# Patient Record
Sex: Female | Born: 1960
Health system: Southern US, Community
[De-identification: ages and names within clinical notes are randomized; demographics above are authoritative.]

## PROBLEM LIST (undated history)

## (undated) DIAGNOSIS — I10 Essential (primary) hypertension: Secondary | ICD-10-CM

## (undated) HISTORY — DX: Essential (primary) hypertension: I10

## (undated) HISTORY — PX: TOTAL ABDOMINAL HYSTERECTOMY: SHX209

## (undated) HISTORY — PX: ABDOMINAL HYSTERECTOMY: SHX81

---

## 2004-04-10 ENCOUNTER — Other Ambulatory Visit: Payer: Self-pay

## 2004-04-10 ENCOUNTER — Emergency Department: Payer: Self-pay | Admitting: Emergency Medicine

## 2004-10-08 ENCOUNTER — Emergency Department: Payer: Self-pay | Admitting: Emergency Medicine

## 2005-09-08 ENCOUNTER — Emergency Department: Payer: Self-pay | Admitting: Emergency Medicine

## 2005-09-08 ENCOUNTER — Other Ambulatory Visit: Payer: Self-pay

## 2007-04-27 ENCOUNTER — Emergency Department: Payer: Self-pay | Admitting: Internal Medicine

## 2009-01-17 ENCOUNTER — Ambulatory Visit: Payer: Self-pay

## 2009-01-17 ENCOUNTER — Ambulatory Visit: Payer: Self-pay | Admitting: Cardiology

## 2009-03-01 ENCOUNTER — Ambulatory Visit: Payer: Self-pay

## 2009-03-10 ENCOUNTER — Ambulatory Visit: Payer: Self-pay

## 2013-10-21 ENCOUNTER — Ambulatory Visit: Payer: Self-pay

## 2014-11-09 DIAGNOSIS — N959 Unspecified menopausal and perimenopausal disorder: Secondary | ICD-10-CM | POA: Insufficient documentation

## 2014-11-09 DIAGNOSIS — N952 Postmenopausal atrophic vaginitis: Secondary | ICD-10-CM | POA: Insufficient documentation

## 2014-11-09 DIAGNOSIS — I10 Essential (primary) hypertension: Secondary | ICD-10-CM

## 2014-11-09 DIAGNOSIS — J309 Allergic rhinitis, unspecified: Secondary | ICD-10-CM | POA: Insufficient documentation

## 2014-11-16 ENCOUNTER — Encounter: Payer: Self-pay | Admitting: Unknown Physician Specialty

## 2014-11-16 ENCOUNTER — Ambulatory Visit (INDEPENDENT_AMBULATORY_CARE_PROVIDER_SITE_OTHER): Payer: Self-pay | Admitting: Unknown Physician Specialty

## 2014-11-16 VITALS — BP 135/87 | HR 71 | Temp 98.8°F | Ht 67.5 in | Wt 187.8 lb

## 2014-11-16 DIAGNOSIS — I10 Essential (primary) hypertension: Secondary | ICD-10-CM

## 2014-11-16 DIAGNOSIS — Z0000 Encounter for general adult medical examination without abnormal findings: Secondary | ICD-10-CM

## 2014-11-16 NOTE — Patient Instructions (Addendum)
Menopause Menopause is the normal time of life when menstrual periods stop completely. Menopause is complete when you have missed 12 consecutive menstrual periods. It usually occurs between the ages of 48 years and 55 years. Very rarely does a woman develop menopause before the age of 40 years. At menopause, your ovaries stop producing the female hormones estrogen and progesterone. This can cause undesirable symptoms and also affect your health. Sometimes the symptoms may occur 4-5 years before the menopause begins. There is no relationship between menopause and:  Oral contraceptives.  Number of children you had.  Race.  The age your menstrual periods started (menarche). Heavy smokers and very thin women may develop menopause earlier in life. CAUSES  The ovaries stop producing the female hormones estrogen and progesterone.  Other causes include:  Surgery to remove both ovaries.  The ovaries stop functioning for no known reason.  Tumors of the pituitary gland in the brain.  Medical disease that affects the ovaries and hormone production.  Radiation treatment to the abdomen or pelvis.  Chemotherapy that affects the ovaries. SYMPTOMS   Hot flashes.  Night sweats.  Decrease in sex drive.  Vaginal dryness and thinning of the vagina causing painful intercourse.  Dryness of the skin and developing wrinkles.  Headaches.  Tiredness.  Irritability.  Memory problems.  Weight gain.  Bladder infections.  Hair growth of the face and chest.  Infertility. More serious symptoms include:  Loss of bone (osteoporosis) causing breaks (fractures).  Depression.  Hardening and narrowing of the arteries (atherosclerosis) causing heart attacks and strokes. DIAGNOSIS   When the menstrual periods have stopped for 12 straight months.  Physical exam.  Hormone studies of the blood. TREATMENT  There are many treatment choices and nearly as many questions about them. The  decisions to treat or not to treat menopausal changes is an individual choice made with your health care provider. Your health care provider can discuss the treatments with you. Together, you can decide which treatment will work best for you. Your treatment choices may include:   Hormone therapy (estrogen and progesterone).  Non-hormonal medicines.  Treating the individual symptoms with medicine (for example antidepressants for depression).  Herbal medicines that may help specific symptoms.  Counseling by a psychiatrist or psychologist.  Group therapy.  Lifestyle changes including:  Eating healthy.  Regular exercise.  Limiting caffeine and alcohol.  Stress management and meditation.  No treatment. HOME CARE INSTRUCTIONS   Take the medicine your health care provider gives you as directed.  Get plenty of sleep and rest.  Exercise regularly.  Eat a diet that contains calcium (good for the bones) and soy products (acts like estrogen hormone).  Avoid alcoholic beverages.  Do not smoke.  If you have hot flashes, dress in layers.  Take supplements, calcium, and vitamin D to strengthen bones.  You can use over-the-counter lubricants or moisturizers for vaginal dryness.  Group therapy is sometimes very helpful.  Acupuncture may be helpful in some cases. SEEK MEDICAL CARE IF:   You are not sure you are in menopause.  You are having menopausal symptoms and need advice and treatment.  You are still having menstrual periods after age 55 years.  You have pain with intercourse.  Menopause is complete (no menstrual period for 12 months) and you develop vaginal bleeding.  You need a referral to a specialist (gynecologist, psychiatrist, or psychologist) for treatment. SEEK IMMEDIATE MEDICAL CARE IF:   You have severe depression.  You have excessive vaginal bleeding.    You fell and think you have a broken bone.  You have pain when you urinate.  You develop leg or  chest pain.  You have a fast pounding heart beat (palpitations).  You have severe headaches.  You develop vision problems.  You feel a lump in your breast.  You have abdominal pain or severe indigestion. Document Released: 07/07/2003 Document Revised: 12/17/2012 Document Reviewed: 11/13/2012 [facility] Patient Information 2015 [place], [place]. This information is not intended to replace advice given to you by your health care provider. Make sure you discuss any questions you have with your health care provider. Menopause and Herbal Products Menopause is the normal time of life when menstrual periods stop completely. Menopause is complete when you have missed 12 consecutive menstrual periods. It usually occurs between the ages of 45 to 59, with an average age of 48. Very rarely does a woman develop menopause before 55 years old. At menopause, your ovaries stop producing the female hormones, estrogen and progesterone. This can cause undesirable symptoms and also affect your health. Sometimes the symptoms can occur 4 to 5 years before the menopause begins. There is no relationship between menopause and:  Oral contraceptives.  Number of children you had.  Race.  The age your menstrual periods started (menarche). Heavy smokers and very thin women may develop menopause earlier in life. Estrogen and progesterone hormone treatment is the usual method of treating menopausal symptoms. However, there are women who should not take hormone treatment. This is true of:   Women that have breast or uterine cancer.  Women who prefer not to take hormones because of certain side effects (abnormal uterine bleeding).  Women who are afraid that hormones may cause breast cancer.  Women who have a history of liver disease, heart disease, stroke, or blood clots. For these women, there are other medications that may help treat their menopausal symptoms. These medications are found in plants and botanical  products. They can be found in the form of herbs, teas, oils, tinctures, and pills.  CAUSES:  The ovaries stop producing the female hormones estrogen and progesterone.  Other causes include:  Surgery to remove both ovaries.  The ovaries stop functioning for no know reason.  Tumors of the pituitary gland in the brain.  Medical disease that affects the ovaries and hormone production.  Radiation treatment to the abdomen or pelvis.  Chemotherapy that affects the ovaries. PHYTOESTROGENS: Phytoestrogens occur naturally in plants and plant products. They act like estrogen in the body. Herbal medications are made from these plants and botanical steroids. There are 3 types of phytoestrogens:  Isoflavones (genistein and daidzein) are found in soy, garbanzo beans, miso and tofu foods.  Ligins are found in the shell of seeds. They are used to make oils like flaxseed oil. The bacteria in your intestine act on these foods to produce the estrogen-like hormones.  Coumestans are estrogen-like. Some of the foods they are found in include sunflower seeds and bean sprouts. CONDITIONS AND THEIR POSSIBLE HERBAL TREATMENT:  Hot flashes and night sweats.  Soy, black cohosh and evening primrose.  Irritability, insomnia, depression and memory problems.  Chasteberry, ginseng, and soy.  St. John's wort may be helpful for depression. However, there is a concern of it causing cataracts of the eye and may have bad effects on other medications. St. John's wort should not be taken for long time and without your caregiver's advice.  Loss of libido and vaginal and skin dryness.  Wild yam and soy.  Prevention of  coronary heart disease and osteoporosis.  Soy and Isoflavones. Several studies have shown that some women benefit from herbal medications, but most of the studies have not consistently shown that these supplements are much better than placebo. Other forms of treatment to help women with menopausal  symptoms include a balanced diet, rest, exercise, vitamin and calcium (with vitamin D) supplements, acupuncture, and group therapy when necessary. THOSE WHO SHOULD NOT TAKE HERBAL MEDICATIONS INCLUDE:  Women who are planning on getting pregnant unless told by your caregiver.  Women who are breastfeeding unless told by your caregiver.  Women who are taking other prescription medications unless told by your caregiver.  Infants, children, and elderly women unless told by your caregiver. Different herbal medications have different and unmeasured amounts of the herbal ingredients. There are no regulations, quality control, and standardization of the ingredients in herbal medications. Therefore, the amount of the ingredient in the medication may vary from one herb, pill, tea, oil or tincture to another. Many herbal medications can cause serious problems and can even have poisonous effects if taken too much or too long. If problems develop, the medication should be stopped and recorded by your caregiver. HOME CARE INSTRUCTIONS  Do not take or give children herbal medications without your caregiver's advice.  Let your caregiver know all the medications you are taking. This includes prescription, over-the-counter, eye drops, and creams.  Do not take herbal medications longer or more than recommended.  Tell your caregiver about any side effects from the medication. SEEK MEDICAL CARE IF:  You develop a fever of 102 F (38.9 C), or as directed by your caregiver.  You feel sick to your stomach (nauseous), vomit, or have diarrhea.  You develop a rash.  You develop abdominal pain.  You develop severe headaches.  You start to have vision problems.  You feel dizzy or faint.  You start to feel numbness in any part of your body.  You start shaking (have convulsions). Document Released: 10/03/2007 Document Revised: 04/02/2012 Document Reviewed: 05/02/2010 [facility] Patient Information 2015  [place], [place]. This information is not intended to replace advice given to you by your health care provider. Make sure you discuss any questions you have with your health care provider.  Astroglide and Replense are useful lubricants in addition to Marcellus jelly

## 2014-11-16 NOTE — Assessment & Plan Note (Signed)
Stable.  Continue present medications 

## 2014-11-16 NOTE — Progress Notes (Signed)
BP 135/87 mmHg  Pulse 71  Temp(Src) 98.8 F (37.1 C)  Ht 5' 7.5" (1.715 m)  Wt 187 lb 12.8 oz (85.186 kg)  BMI 28.96 kg/m2  SpO2 98%   Subjective:    Patient ID: Nichole Palmer, female    DOB: 04-Jun-1960, 54 y.o.   MRN: [id number]  HPI: Nichole Palmer is a 54 y.o. female  Chief Complaint  Patient presents with  . Annual Exam   Hypertension This is a chronic problem. The problem is controlled. Associated symptoms include sweats. Pertinent negatives include no chest pain, headaches, palpitations or shortness of breath. There are no compliance problems.      Relevant past medical, surgical, family and social history reviewed and updated as indicated. Interim medical history since our last visit reviewed. Allergies and medications reviewed and updated.  Review of Systems  Constitutional: Negative.   HENT: Negative.   Eyes: Negative.   Respiratory: Negative.  Negative for shortness of breath.   Cardiovascular: Negative.  Negative for chest pain and palpitations.  Gastrointestinal: Negative.   Endocrine:       Having hot flashes and vaginal dryness  Genitourinary: Negative.   Musculoskeletal: Negative.   Skin: Negative.   Allergic/Immunologic: Negative.   Neurological: Negative.  Negative for headaches.  Hematological: Negative.   Psychiatric/Behavioral: Negative.     Per HPI unless specifically indicated above     Objective:    BP 135/87 mmHg  Pulse 71  Temp(Src) 98.8 F (37.1 C)  Ht 5' 7.5" (1.715 m)  Wt 187 lb 12.8 oz (85.186 kg)  BMI 28.96 kg/m2  SpO2 98%  Wt Readings from Last 3 Encounters:  11/16/14 187 lb 12.8 oz (85.186 kg)  08/13/14 190 lb (86.183 kg)    Physical Exam  Constitutional: She is oriented to person, place, and time. She appears well-developed and well-nourished.  HENT:  Head: Normocephalic and atraumatic.  Eyes: Pupils are equal, round, and reactive to light. Right eye exhibits no discharge. Left eye exhibits no discharge. No  scleral icterus.  Neck: Normal range of motion. Neck supple. Carotid bruit is not present. No thyromegaly present.  Cardiovascular: Normal rate, regular rhythm and normal heart sounds.  Exam reveals no gallop and no friction rub.   No murmur heard. Pulmonary/Chest: Effort normal and breath sounds normal. No respiratory distress. She has no wheezes. She has no rales.  Abdominal: Soft. Bowel sounds are normal. There is no tenderness. There is no rebound.  Genitourinary: No breast swelling, tenderness or discharge.  Musculoskeletal: Normal range of motion.  Lymphadenopathy:    She has no cervical adenopathy.  Neurological: She is alert and oriented to person, place, and time.  Skin: Skin is warm, dry and intact. No rash noted.  Psychiatric: She has a normal mood and affect. Her speech is normal and behavior is normal. Judgment and thought content normal. Cognition and memory are normal.      Assessment & Plan:   Problem List Items Addressed This Visit      Cardiovascular and Mediastinum   Hypertension - Primary    Stable.  Continue present medications      Relevant Orders   Comprehensive metabolic panel   Lipid Panel w/o Chol/HDL Ratio   TSH   Microalbumin, Urine Waived   Uric acid    Other Visit Diagnoses    Annual physical exam        Relevant Orders    CBC    Comprehensive metabolic panel    Lipid  Panel w/o Chol/HDL Ratio    TSH    MM DIGITAL SCREENING BILATERAL        Follow up plan: Return in about 6 months (around 05/19/2015) for BP.

## 2014-11-17 ENCOUNTER — Encounter: Payer: Self-pay | Admitting: Unknown Physician Specialty

## 2014-11-17 LAB — CBC
HEMOGLOBIN: 11.7 g/dL (ref 11.1–15.9)
Hematocrit: 35.4 % (ref 34.0–46.6)
MCH: 27.7 pg (ref 26.6–33.0)
MCHC: 33.1 g/dL (ref 31.5–35.7)
MCV: 84 fL (ref 79–97)
Platelets: 204 10*3/uL (ref 150–379)
RBC: 4.22 x10E6/uL (ref 3.77–5.28)
RDW: 14.4 % (ref 12.3–15.4)
WBC: 4.5 10*3/uL (ref 3.4–10.8)

## 2014-11-17 LAB — URIC ACID: URIC ACID: 3.9 mg/dL (ref 2.5–7.1)

## 2014-11-17 LAB — COMPREHENSIVE METABOLIC PANEL
A/G RATIO: 1.4 (ref 1.1–2.5)
ALK PHOS: 69 IU/L (ref 39–117)
ALT: 12 IU/L (ref 0–32)
AST: 14 IU/L (ref 0–40)
Albumin: 4.1 g/dL (ref 3.5–5.5)
BUN/Creatinine Ratio: 17 (ref 9–23)
BUN: 18 mg/dL (ref 6–24)
Bilirubin Total: <0.2 mg/dL (ref 0.0–1.2)
CALCIUM: 9.5 mg/dL (ref 8.7–10.2)
CO2: 24 mmol/L (ref 18–29)
Chloride: 101 mmol/L (ref 97–108)
Creatinine, Ser: 1.07 mg/dL — ABNORMAL HIGH (ref 0.57–1.00)
GFR calc Af Amer: 68 mL/min/{1.73_m2} (ref >59)
GFR calc non Af Amer: 59 mL/min/{1.73_m2} — ABNORMAL LOW (ref >59)
GLUCOSE: 77 mg/dL (ref 65–99)
Globulin, Total: 2.9 g/dL (ref 1.5–4.5)
Potassium: 4.2 mmol/L (ref 3.5–5.2)
Sodium: 142 mmol/L (ref 134–144)
Total Protein: 7 g/dL (ref 6.0–8.5)

## 2014-11-17 LAB — MICROALBUMIN, URINE WAIVED
Creatinine, Urine Waived: 200 mg/dL (ref 10–300)
Microalb, Ur Waived: 10 mg/L (ref 0–19)
Microalb/Creat Ratio: <30 mg/g (ref <30)

## 2014-11-17 LAB — LIPID PANEL W/O CHOL/HDL RATIO
Cholesterol, Total: 137 mg/dL (ref 100–199)
HDL: 49 mg/dL (ref >39)
LDL Calculated: 68 mg/dL (ref 0–99)
Triglycerides: 101 mg/dL (ref 0–149)
VLDL Cholesterol Cal: 20 mg/dL (ref 5–40)

## 2014-11-17 LAB — TSH: TSH: 1.96 u[IU]/mL (ref 0.450–4.500)

## 2015-05-20 ENCOUNTER — Encounter: Payer: Self-pay | Admitting: Unknown Physician Specialty

## 2015-05-20 ENCOUNTER — Ambulatory Visit (INDEPENDENT_AMBULATORY_CARE_PROVIDER_SITE_OTHER): Payer: BLUE CROSS/BLUE SHIELD | Admitting: Unknown Physician Specialty

## 2015-05-20 VITALS — BP 160/97 | HR 71 | Temp 98.5°F | Ht 66.8 in | Wt 185.2 lb

## 2015-05-20 DIAGNOSIS — I10 Essential (primary) hypertension: Secondary | ICD-10-CM | POA: Diagnosis not present

## 2015-05-20 MED ORDER — AMLODIPINE BESYLATE 10 MG PO TABS: 10.0000 mg | ORAL_TABLET | Freq: Every day | ORAL | Status: DC

## 2015-05-20 MED ORDER — LOSARTAN POTASSIUM-HCTZ 100-25 MG PO TABS: 1.0000 | ORAL_TABLET | Freq: Every day | ORAL | Status: DC

## 2015-05-20 NOTE — Patient Instructions (Addendum)
DASH Eating Plan DASH stands for "Dietary Approaches to Stop Hypertension." The DASH eating plan is a healthy eating plan that has been shown to reduce high blood pressure (hypertension). Additional health benefits may include reducing the risk of type 2 diabetes mellitus, heart disease, and stroke. The DASH eating plan may also help with weight loss. WHAT DO I NEED TO KNOW ABOUT THE DASH EATING PLAN? For the DASH eating plan, you will follow these general guidelines:  Choose foods with a percent daily value for sodium of less than 5% (as listed on the food label).  Use salt-free seasonings or herbs instead of table salt or sea salt.  Check with your health care provider or pharmacist before using salt substitutes.  Eat lower-sodium products, often labeled as "lower sodium" or "no salt added."  Eat fresh foods.  Eat more vegetables, fruits, and low-fat dairy products.  Choose whole grains. Look for the word "whole" as the first word in the ingredient list.  Choose fish and skinless chicken or turkey more often than red meat. Limit fish, poultry, and meat to 6 oz (170 g) each day.  Limit sweets, desserts, sugars, and sugary drinks.  Choose heart-healthy fats.  Limit cheese to 1 oz (28 g) per day.  Eat more home-cooked food and less restaurant, buffet, and fast food.  Limit fried foods.  Cook foods using methods other than frying.  Limit canned vegetables. If you do use them, rinse them well to decrease the sodium.  When eating at a restaurant, ask that your food be prepared with less salt, or no salt if possible. WHAT FOODS CAN I EAT? Seek help from a dietitian for individual calorie needs. Grains Whole grain or whole wheat bread. Brown rice. Whole grain or whole wheat pasta. Quinoa, bulgur, and whole grain cereals. Low-sodium cereals. Corn or whole wheat flour tortillas. Whole grain cornbread. Whole grain crackers. Low-sodium crackers. Vegetables Fresh or frozen vegetables  (raw, steamed, roasted, or grilled). Low-sodium or reduced-sodium tomato and vegetable juices. Low-sodium or reduced-sodium tomato sauce and paste. Low-sodium or reduced-sodium canned vegetables.  Fruits All fresh, canned (in natural juice), or frozen fruits. Meat and Other Protein Products Ground beef (85% or leaner), grass-fed beef, or beef trimmed of fat. Skinless chicken or turkey. Ground chicken or turkey. Pork trimmed of fat. All fish and seafood. Eggs. Dried beans, peas, or lentils. Unsalted nuts and seeds. Unsalted canned beans. Dairy Low-fat dairy products, such as skim or 1% milk, 2% or reduced-fat cheeses, low-fat ricotta or cottage cheese, or plain low-fat yogurt. Low-sodium or reduced-sodium cheeses. Fats and Oils Tub margarines without trans fats. Light or reduced-fat mayonnaise and salad dressings (reduced sodium). Avocado. Safflower, olive, or canola oils. Natural peanut or almond butter. Other Unsalted popcorn and pretzels. The items listed above may not be a complete list of recommended foods or beverages. Contact your dietitian for more options. WHAT FOODS ARE NOT RECOMMENDED? Grains White bread. White pasta. White rice. Refined cornbread. Bagels and croissants. Crackers that contain trans fat. Vegetables Creamed or fried vegetables. Vegetables in a cheese sauce. Regular canned vegetables. Regular canned tomato sauce and paste. Regular tomato and vegetable juices. Fruits Dried fruits. Canned fruit in light or heavy syrup. Fruit juice. Meat and Other Protein Products Fatty cuts of meat. Ribs, chicken wings, bacon, sausage, bologna, salami, chitterlings, fatback, hot dogs, bratwurst, and packaged luncheon meats. Salted nuts and seeds. Canned beans with salt. Dairy Whole or 2% milk, cream, half-and-half, and cream cheese. Whole-fat or sweetened yogurt. Full-fat   cheeses or blue cheese. Nondairy creamers and whipped toppings. Processed cheese, cheese spreads, or cheese  curds. Condiments Onion and garlic salt, seasoned salt, table salt, and sea salt. Canned and packaged gravies. Worcestershire sauce. Tartar sauce. Barbecue sauce. Teriyaki sauce. Soy sauce, including reduced sodium. Steak sauce. Fish sauce. Oyster sauce. Cocktail sauce. Horseradish. Ketchup and mustard. Meat flavorings and tenderizers. Bouillon cubes. Hot sauce. Tabasco sauce. Marinades. Taco seasonings. Relishes. Fats and Oils Butter, stick margarine, lard, shortening, ghee, and bacon fat. Coconut, palm kernel, or palm oils. Regular salad dressings. Other Pickles and olives. Salted popcorn and pretzels. The items listed above may not be a complete list of foods and beverages to avoid. Contact your dietitian for more information. WHERE CAN I FIND MORE INFORMATION? National Heart, Lung, and Blood Institute: travelstabloid.com   This information is not intended to replace advice given to you by your health care provider. Make sure you discuss any questions you have with your health care provider.   Document Released: 04/05/2011 Document Revised: 05/07/2014 Document Reviewed: 02/18/2013 Elsevier Interactive Patient Education 2016 [employer]  Please do call to schedule your mammogram; the number to schedule one at either [facility] or [facility] is [phone]

## 2015-05-20 NOTE — Assessment & Plan Note (Addendum)
Not to goal.  Taking OTC cold medications.  Borderline pressures at home.  Increase Amlodipine to 10mg .

## 2015-05-20 NOTE — Progress Notes (Signed)
BP 160/97 mmHg  Pulse 71  Temp(Src) 98.5 F (36.9 C)  Ht 5' 6.8" (1.697 m)  Wt 185 lb 3.2 oz (84.006 kg)  BMI 29.17 kg/m2  SpO2 100%  LMP  (LMP Unknown)   Subjective:    Patient ID: Nichole Palmer, female    DOB: 03-Jul-1960, 55 y.o.   MRN: [id number]  HPI: Nichole Palmer is a 55 y.o. female  Chief Complaint  Patient presents with  . Hypertension   Hypertension Taking OTC cold medication Using medications without difficulty.  Average home BPs 140/87 is an example.    No problems or lightheadedness No chest pain with exertion or shortness of breath No Edema    Relevant past medical, surgical, family and social history reviewed and updated as indicated. Interim medical history since our last visit reviewed. Allergies and medications reviewed and updated.  Review of Systems  Per HPI unless specifically indicated above     Objective:    BP 160/97 mmHg  Pulse 71  Temp(Src) 98.5 F (36.9 C)  Ht 5' 6.8" (1.697 m)  Wt 185 lb 3.2 oz (84.006 kg)  BMI 29.17 kg/m2  SpO2 100%  LMP  (LMP Unknown)  Wt Readings from Last 3 Encounters:  05/20/15 185 lb 3.2 oz (84.006 kg)  11/16/14 187 lb 12.8 oz (85.186 kg)  08/13/14 190 lb (86.183 kg)    Physical Exam  Constitutional: She is oriented to person, place, and time. She appears well-developed and well-nourished. No distress.  HENT:  Head: Normocephalic and atraumatic.  Eyes: Conjunctivae and lids are normal. Right eye exhibits no discharge. Left eye exhibits no discharge. No scleral icterus.  Neck: Normal range of motion. Neck supple. No JVD present. Carotid bruit is not present.  Cardiovascular: Normal rate, regular rhythm and normal heart sounds.   Pulmonary/Chest: Effort normal and breath sounds normal.  Abdominal: Normal appearance. There is no splenomegaly or hepatomegaly.  Musculoskeletal: Normal range of motion.  Neurological: She is alert and oriented to person, place, and time.  Skin: Skin is warm, dry and  intact. No rash noted. No pallor.  - Psychiatric: She has a normal mood and affect. Her behavior is normal. Judgment and thought content normal.    Results for orders placed or performed in visit on 11/16/14  CBC  Result Value Ref Range   WBC 4.5 3.4 - 10.8 x10E3/uL   RBC 4.22 3.77 - 5.28 x10E6/uL   Hemoglobin 11.7 11.1 - 15.9 g/dL   Hematocrit 35.4 34.0 - 46.6 %   MCV 84 79 - 97 fL   MCH 27.7 26.6 - 33.0 pg   MCHC 33.1 31.5 - 35.7 g/dL   RDW 14.4 12.3 - 15.4 %   Platelets 204 150 - 379 x10E3/uL  Comprehensive metabolic panel  Result Value Ref Range   Glucose 77 65 - 99 mg/dL   BUN 18 6 - 24 mg/dL   Creatinine, Ser 1.07 (H) 0.57 - 1.00 mg/dL   GFR calc non Af Amer 59 (L) >59 mL/min/1.73   GFR calc Af Amer 68 >59 mL/min/1.73   BUN/Creatinine Ratio 17 9 - 23   Sodium 142 134 - 144 mmol/L   Potassium 4.2 3.5 - 5.2 mmol/L   Chloride 101 97 - 108 mmol/L   CO2 24 18 - 29 mmol/L   Calcium 9.5 8.7 - 10.2 mg/dL   Total Protein 7.0 6.0 - 8.5 g/dL   Albumin 4.1 3.5 - 5.5 g/dL   Globulin, Total 2.9 1.5 - 4.5  g/dL   Albumin/Globulin Ratio 1.4 1.1 - 2.5   Bilirubin Total <0.2 0.0 - 1.2 mg/dL   Alkaline Phosphatase 69 39 - 117 IU/L   AST 14 0 - 40 IU/L   ALT 12 0 - 32 IU/L  Lipid Panel w/o Chol/HDL Ratio  Result Value Ref Range   Cholesterol, Total 137 100 - 199 mg/dL   Triglycerides 101 0 - 149 mg/dL   HDL 49 >39 mg/dL   VLDL Cholesterol Cal 20 5 - 40 mg/dL   LDL Calculated 68 0 - 99 mg/dL  TSH  Result Value Ref Range   TSH 1.960 0.450 - 4.500 uIU/mL  Microalbumin, Urine Waived  Result Value Ref Range   Microalb, Ur Waived 10 0 - 19 mg/L   Creatinine, Urine Waived 200 10 - 300 mg/dL   Microalb/Creat Ratio <30 <30 mg/g  Uric acid  Result Value Ref Range   Uric Acid 3.9 2.5 - 7.1 mg/dL      Assessment & Plan:   Problem List Items Addressed This Visit      Unprioritized   Hypertension - Primary    Not to goal.  Taking OTC cold medications.  Borderline pressures at home.   Increase Amlodipine to 10mg .        Relevant Medications   losartan-hydrochlorothiazide (HYZAAR) 100-25 MG tablet   amLODipine (NORVASC) 10 MG tablet   Other Relevant Orders   Comprehensive metabolic panel       Follow up plan: No Follow-up on file.

## 2015-05-21 LAB — COMPREHENSIVE METABOLIC PANEL
ALBUMIN: 4.2 g/dL (ref 3.5–5.5)
ALK PHOS: 79 IU/L (ref 39–117)
ALT: 13 IU/L (ref 0–32)
AST: 19 IU/L (ref 0–40)
Albumin/Globulin Ratio: 1.2 (ref 1.1–2.5)
BUN / CREAT RATIO: 16 (ref 9–23)
BUN: 15 mg/dL (ref 6–24)
CHLORIDE: 100 mmol/L (ref 96–106)
CO2: 27 mmol/L (ref 18–29)
Calcium: 9.6 mg/dL (ref 8.7–10.2)
Creatinine, Ser: 0.95 mg/dL (ref 0.57–1.00)
GFR calc Af Amer: 78 mL/min/{1.73_m2} (ref >59)
GFR calc non Af Amer: 68 mL/min/{1.73_m2} (ref >59)
GLUCOSE: 95 mg/dL (ref 65–99)
Globulin, Total: 3.4 g/dL (ref 1.5–4.5)
Potassium: 4.4 mmol/L (ref 3.5–5.2)
Sodium: 142 mmol/L (ref 134–144)
Total Protein: 7.6 g/dL (ref 6.0–8.5)

## 2015-06-21 ENCOUNTER — Ambulatory Visit (INDEPENDENT_AMBULATORY_CARE_PROVIDER_SITE_OTHER): Payer: BLUE CROSS/BLUE SHIELD | Admitting: Unknown Physician Specialty

## 2015-06-21 ENCOUNTER — Encounter: Payer: Self-pay | Admitting: Unknown Physician Specialty

## 2015-06-21 VITALS — BP 148/86 | HR 80 | Temp 98.1°F | Ht 66.2 in | Wt 186.2 lb

## 2015-06-21 DIAGNOSIS — I10 Essential (primary) hypertension: Secondary | ICD-10-CM

## 2015-06-21 DIAGNOSIS — J301 Allergic rhinitis due to pollen: Secondary | ICD-10-CM | POA: Diagnosis not present

## 2015-06-21 DIAGNOSIS — G4730 Sleep apnea, unspecified: Secondary | ICD-10-CM | POA: Diagnosis not present

## 2015-06-21 MED ORDER — AMLODIPINE BESYLATE 10 MG PO TABS
10.0000 mg | ORAL_TABLET | Freq: Every day | ORAL | Status: DC
Start: 2015-06-21 — End: 2016-02-20

## 2015-06-21 MED ORDER — LOSARTAN POTASSIUM-HCTZ 100-25 MG PO TABS: 1.0000 | ORAL_TABLET | Freq: Every day | ORAL | Status: DC

## 2015-06-21 MED ORDER — FLUTICASONE PROPIONATE 50 MCG/ACT NA SUSP: 2.0000 | Freq: Every day | NASAL | Status: DC

## 2015-06-21 NOTE — Assessment & Plan Note (Signed)
rx for Flonase 

## 2015-06-21 NOTE — Assessment & Plan Note (Signed)
Make no changes today.  Improve compliance

## 2015-06-21 NOTE — Progress Notes (Signed)
BP 148/86 mmHg  Pulse 80  Temp(Src) 98.1 F (36.7 C)  Ht 5' 6.2" (1.681 m)  Wt 186 lb 3.2 oz (84.46 kg)  BMI 29.89 kg/m2  SpO2 100%   Subjective:    Patient ID: Nichole Palmer, female    DOB: 1960/09/30, 55 y.o.   MRN: [id number]  HPI: Nichole Palmer is a 55 y.o. female  Chief Complaint  Patient presents with  . Hypertension  . Medication Refill    pt states she needs refill on flonase   Hypertension Using medications without difficulty most of the time but sometimes forgets.   Average home BPs 140s/80's  No problems or lightheadedness No chest pain with exertion or shortness of breath No Edema  Pt does admit to snoring loudly.  She does does get sleepy during the day while watching tv.  Often doesn't wake feeling rested.  She drinks ETOH on occasion.    Allergic rhinnitis History of allergies and wants a refill of Flonase.   Relevant past medical, surgical, family and social history reviewed and updated as indicated. Interim medical history since our last visit reviewed. Allergies and medications reviewed and updated.  Review of Systems  Per HPI unless specifically indicated above     Objective:    BP 148/86 mmHg  Pulse 80  Temp(Src) 98.1 F (36.7 C)  Ht 5' 6.2" (1.681 m)  Wt 186 lb 3.2 oz (84.46 kg)  BMI 29.89 kg/m2  SpO2 100%  Wt Readings from Last 3 Encounters:  06/21/15 186 lb 3.2 oz (84.46 kg)  05/20/15 185 lb 3.2 oz (84.006 kg)  11/16/14 187 lb 12.8 oz (85.186 kg)    Physical Exam  Constitutional: She is oriented to person, place, and time. She appears well-developed and well-nourished. No distress.  HENT:  Head: Normocephalic and atraumatic.  Eyes: Conjunctivae and lids are normal. Right eye exhibits no discharge. Left eye exhibits no discharge. No scleral icterus.  Neck: Normal range of motion. Neck supple. No JVD present. Carotid bruit is not present.  Cardiovascular: Normal rate, regular rhythm and normal heart sounds.    Pulmonary/Chest: Effort normal and breath sounds normal.  Abdominal: Normal appearance. There is no splenomegaly or hepatomegaly.  Musculoskeletal: Normal range of motion.  Neurological: She is alert and oriented to person, place, and time.  Skin: Skin is warm, dry and intact. No rash noted. No pallor.  Psychiatric: She has a normal mood and affect. Her behavior is normal. Judgment and thought content normal.    Results for orders placed or performed in visit on 05/20/15  Comprehensive metabolic panel  Result Value Ref Range   Glucose 95 65 - 99 mg/dL   BUN 15 6 - 24 mg/dL   Creatinine, Ser 0.95 0.57 - 1.00 mg/dL   GFR calc non Af Amer 68 >59 mL/min/1.73   GFR calc Af Amer 78 >59 mL/min/1.73   BUN/Creatinine Ratio 16 9 - 23   Sodium 142 134 - 144 mmol/L   Potassium 4.4 3.5 - 5.2 mmol/L   Chloride 100 96 - 106 mmol/L   CO2 27 18 - 29 mmol/L   Calcium 9.6 8.7 - 10.2 mg/dL   Total Protein 7.6 6.0 - 8.5 g/dL   Albumin 4.2 3.5 - 5.5 g/dL   Globulin, Total 3.4 1.5 - 4.5 g/dL   Albumin/Globulin Ratio 1.2 1.1 - 2.5   Bilirubin Total <0.2 0.0 - 1.2 mg/dL   Alkaline Phosphatase 79 39 - 117 IU/L   AST 19 0 -  40 IU/L   ALT 13 0 - 32 IU/L      Assessment & Plan:   Problem List Items Addressed This Visit      Unprioritized   Hypertension - Primary    Make no changes today.  Improve compliance      Relevant Medications   amLODipine (NORVASC) 10 MG tablet   losartan-hydrochlorothiazide (HYZAAR) 100-25 MG tablet   Other Relevant Orders   Ambulatory referral to Sleep Studies   Allergic rhinitis    rx for Flonase      Sleep apnea    Order sleep study to r/o secondary cause      Relevant Orders   Ambulatory referral to Sleep Studies       Follow up plan: Return in about 3 months (around 09/18/2015).

## 2015-06-21 NOTE — Assessment & Plan Note (Addendum)
Order sleep study to r/o secondary cause

## 2015-08-24 ENCOUNTER — Encounter: Payer: Self-pay | Admitting: Unknown Physician Specialty

## 2015-08-24 ENCOUNTER — Ambulatory Visit (INDEPENDENT_AMBULATORY_CARE_PROVIDER_SITE_OTHER): Payer: BLUE CROSS/BLUE SHIELD | Admitting: Unknown Physician Specialty

## 2015-08-24 VITALS — BP 158/94 | HR 70 | Temp 98.9°F | Ht 66.3 in | Wt 185.4 lb

## 2015-08-24 DIAGNOSIS — N898 Other specified noninflammatory disorders of vagina: Secondary | ICD-10-CM | POA: Diagnosis not present

## 2015-08-24 DIAGNOSIS — I10 Essential (primary) hypertension: Secondary | ICD-10-CM | POA: Diagnosis not present

## 2015-08-24 LAB — WET PREP FOR TRICH, YEAST, CLUE
Clue Cell Exam: NEGATIVE
Trichomonas Exam: NEGATIVE
Yeast Exam: NEGATIVE

## 2015-08-24 MED ORDER — FLUCONAZOLE 150 MG PO TABS: 150.0000 mg | ORAL_TABLET | Freq: Once | ORAL | Status: DC

## 2015-08-24 MED ORDER — METRONIDAZOLE 500 MG PO TABS: 500.0000 mg | ORAL_TABLET | Freq: Three times a day (TID) | ORAL | Status: DC

## 2015-08-24 NOTE — Progress Notes (Signed)
BP 158/94 mmHg  Pulse 70  Temp(Src) 98.9 F (37.2 C)  Ht 5' 6.3" (1.684 m)  Wt 185 lb 6.4 oz (84.097 kg)  BMI 29.65 kg/m2  SpO2 98%   Subjective:    Patient ID: Nichole Palmer, female    DOB: 1961/04/16, 55 y.o.   MRN: [id number]  HPI: Nichole Palmer is a 55 y.o. female  Chief Complaint  Patient presents with  . Vaginal Discharge    pt states she has been having vaginal discharge since Saturday night   Vaginal Discharge The patient's primary symptoms include genital itching and vaginal discharge. The patient's pertinent negatives include no genital odor, genital rash, pelvic pain or vaginal bleeding. This is a new problem. The current episode started in the past 7 days. The problem occurs constantly. The problem has been gradually worsening. The patient is experiencing no pain. She is not pregnant. Associated symptoms include rash (yes but on upper inner thighs). Pertinent negatives include no abdominal pain, back pain, chills, discolored urine, dysuria, fever, flank pain, hematuria or urgency. The vaginal discharge was milky and brown. There has been no bleeding. Nothing aggravates the symptoms. She has tried warm baths (sat in warm vinegar and water bath) for the symptoms. The treatment provided no relief. She is not sexually active. No, her partner does not have an STD. She uses nothing for contraception. She is postmenopausal. Her past medical history is significant for vaginosis.   Hypertension: BP high today. Patient states her BP is always high when she is here. Has regular scheduled BP follow up in a month or so. Patient states she will "have her BP down by then" with diet and exercise. Will follow up in 4 weeks.   Relevant past medical, surgical, family and social history reviewed and updated as indicated. Interim medical history since our last visit reviewed. Allergies and medications reviewed and updated.  Review of Systems  Constitutional: Negative for fever and  chills.  Gastrointestinal: Negative for abdominal pain.  Genitourinary: Positive for vaginal discharge. Negative for dysuria, urgency, hematuria, flank pain and pelvic pain.  Musculoskeletal: Negative for back pain.  Skin: Positive for rash (yes but on upper inner thighs).    Per HPI unless specifically indicated above     Objective:    BP 158/94 mmHg  Pulse 70  Temp(Src) 98.9 F (37.2 C)  Ht 5' 6.3" (1.684 m)  Wt 185 lb 6.4 oz (84.097 kg)  BMI 29.65 kg/m2  SpO2 98%  Wt Readings from Last 3 Encounters:  08/24/15 185 lb 6.4 oz (84.097 kg)  06/21/15 186 lb 3.2 oz (84.46 kg)  05/20/15 185 lb 3.2 oz (84.006 kg)    Physical Exam  Constitutional: She is oriented to person, place, and time. She appears well-developed and well-nourished. No distress.  HENT:  Head: Normocephalic and atraumatic.  Eyes: Conjunctivae and lids are normal. Right eye exhibits no discharge. Left eye exhibits no discharge. No scleral icterus.  Cardiovascular: Normal rate.   Pulmonary/Chest: Effort normal.  Abdominal: Normal appearance. There is no splenomegaly or hepatomegaly.  Genitourinary: Vaginal discharge (light brown and greenish color) found.  Musculoskeletal: Normal range of motion.  Neurological: She is alert and oriented to person, place, and time.  Skin: Skin is intact. No rash noted. No pallor.  Psychiatric: She has a normal mood and affect. Her behavior is normal. Judgment and thought content normal.        Assessment & Plan:   Problem List Items Addressed This Visit  Unprioritized   Hypertension    BP high today. Rechecked and still not to goal. Patient states her BP is always high when she is here and that she will have it down before her next appt with diet and exercise. Regular BP follow up in 4 weeks. No changes to meds today.        Other Visit Diagnoses    Vaginal discharge    -  Primary    Suspicious of bacterial vaginosis despite negative wet mount results. Start  metronidazole.     Relevant Orders    WET PREP FOR [place], YEAST, CLUE        Follow up plan: Return in about 4 weeks (around 09/21/2015) for regular BP follow up.

## 2015-08-24 NOTE — Assessment & Plan Note (Signed)
BP high today. Rechecked and still not to goal. Patient states her BP is always high when she is here and that she will have it down before her next appt with diet and exercise. Regular BP follow up in 4 weeks. No changes to meds today.

## 2015-08-31 ENCOUNTER — Ambulatory Visit
Admission: RE | Admit: 2015-08-31 | Discharge: 2015-08-31 | Disposition: A | Discharge status: Home / Self Care | Payer: BLUE CROSS/BLUE SHIELD | Source: Ambulatory Visit | Attending: Unknown Physician Specialty | Admitting: Unknown Physician Specialty

## 2015-08-31 DIAGNOSIS — Z1231 Encounter for screening mammogram for malignant neoplasm of breast: Secondary | ICD-10-CM | POA: Diagnosis not present

## 2015-08-31 DIAGNOSIS — Z0000 Encounter for general adult medical examination without abnormal findings: Secondary | ICD-10-CM

## 2015-09-05 ENCOUNTER — Other Ambulatory Visit: Payer: Self-pay | Admitting: Unknown Physician Specialty

## 2015-09-05 DIAGNOSIS — N6489 Other specified disorders of breast: Secondary | ICD-10-CM

## 2015-09-14 ENCOUNTER — Ambulatory Visit: Payer: BLUE CROSS/BLUE SHIELD

## 2015-09-14 ENCOUNTER — Ambulatory Visit
Admission: RE | Admit: 2015-09-14 | Discharge: 2015-09-14 | Disposition: A | Discharge status: Home / Self Care | Payer: BLUE CROSS/BLUE SHIELD | Source: Ambulatory Visit | Attending: Unknown Physician Specialty | Admitting: Unknown Physician Specialty

## 2015-09-14 DIAGNOSIS — N6489 Other specified disorders of breast: Secondary | ICD-10-CM | POA: Insufficient documentation

## 2015-09-14 DIAGNOSIS — R928 Other abnormal and inconclusive findings on diagnostic imaging of breast: Secondary | ICD-10-CM | POA: Diagnosis not present

## 2015-09-19 ENCOUNTER — Encounter: Payer: Self-pay | Admitting: Unknown Physician Specialty

## 2015-09-19 ENCOUNTER — Ambulatory Visit (INDEPENDENT_AMBULATORY_CARE_PROVIDER_SITE_OTHER): Payer: BLUE CROSS/BLUE SHIELD | Admitting: Unknown Physician Specialty

## 2015-09-19 VITALS — BP 119/77 | HR 76 | Temp 98.2°F | Ht 66.5 in | Wt 182.6 lb

## 2015-09-19 DIAGNOSIS — I10 Essential (primary) hypertension: Secondary | ICD-10-CM | POA: Diagnosis not present

## 2015-09-19 NOTE — Assessment & Plan Note (Signed)
Stable, continue present medications.   

## 2015-09-19 NOTE — Progress Notes (Signed)
   BP 119/77 mmHg  Pulse 76  Temp(Src) 98.2 F (36.8 C)  Ht 5' 6.5" (1.689 m)  Wt 182 lb 9.6 oz (82.827 kg)  BMI 29.03 kg/m2  SpO2 97%   Subjective:    Patient ID: Nichole Palmer, female    DOB: Nov 21, 1960, 55 y.o.   MRN: [id number]  HPI: Nichole Palmer is a 55 y.o. female  Chief Complaint  Patient presents with  . Hypertension    Hypertension No change last visit.  Worked on lifestyle Using medications without difficulty Average home BPs Not checking  Using medication without problems or lightheadedness No chest pain with exertion or shortness of breath No Edema  Relevant past medical, surgical, family and social history reviewed and updated as indicated. Interim medical history since our last visit reviewed. Allergies and medications reviewed and updated.  Review of Systems  Per HPI unless specifically indicated above     Objective:    BP 119/77 mmHg  Pulse 76  Temp(Src) 98.2 F (36.8 C)  Ht 5' 6.5" (1.689 m)  Wt 182 lb 9.6 oz (82.827 kg)  BMI 29.03 kg/m2  SpO2 97%  Wt Readings from Last 3 Encounters:  09/19/15 182 lb 9.6 oz (82.827 kg)  08/24/15 185 lb 6.4 oz (84.097 kg)  06/21/15 186 lb 3.2 oz (84.46 kg)    Physical Exam  Constitutional: She is oriented to person, place, and time. She appears well-developed and well-nourished. No distress.  HENT:  Head: Normocephalic and atraumatic.  Eyes: Conjunctivae and lids are normal. Right eye exhibits no discharge. Left eye exhibits no discharge. No scleral icterus.  Neck: Normal range of motion. Neck supple. No JVD present. Carotid bruit is not present.  Cardiovascular: Normal rate, regular rhythm and normal heart sounds.   Pulmonary/Chest: Effort normal and breath sounds normal.  Abdominal: Normal appearance. There is no splenomegaly or hepatomegaly.  Musculoskeletal: Normal range of motion.  Neurological: She is alert and oriented to person, place, and time.  Skin: Skin is warm, dry and intact. No rash  noted. No pallor.  Psychiatric: She has a normal mood and affect. Her behavior is normal. Judgment and thought content normal.    Results for orders placed or performed in visit on 08/24/15  WET PREP FOR [place], YEAST, CLUE  Result Value Ref Range   Trichomonas Exam Negative Negative   Yeast Exam Negative Negative   Clue Cell Exam Negative Negative      Assessment & Plan:   Problem List Items Addressed This Visit      Unprioritized   Hypertension - Primary    Stable, continue present medications.            Follow up plan: Return in about 5 months (around 02/19/2016).

## 2015-10-29 ENCOUNTER — Other Ambulatory Visit: Payer: Self-pay | Admitting: Unknown Physician Specialty

## 2016-02-20 ENCOUNTER — Ambulatory Visit (INDEPENDENT_AMBULATORY_CARE_PROVIDER_SITE_OTHER): Payer: BLUE CROSS/BLUE SHIELD | Admitting: Unknown Physician Specialty

## 2016-02-20 ENCOUNTER — Encounter: Payer: Self-pay | Admitting: Unknown Physician Specialty

## 2016-02-20 DIAGNOSIS — J301 Allergic rhinitis due to pollen: Secondary | ICD-10-CM | POA: Diagnosis not present

## 2016-02-20 DIAGNOSIS — I10 Essential (primary) hypertension: Secondary | ICD-10-CM | POA: Diagnosis not present

## 2016-02-20 MED ORDER — FLUTICASONE PROPIONATE 50 MCG/ACT NA SUSP: 2.0000 | Freq: Every day | NASAL | 12 refills | Status: DC

## 2016-02-20 MED ORDER — AMLODIPINE BESYLATE 10 MG PO TABS: 10.0000 mg | ORAL_TABLET | Freq: Every day | ORAL | 3 refills | Status: DC

## 2016-02-20 MED ORDER — LOSARTAN POTASSIUM-HCTZ 100-25 MG PO TABS
1.0000 | ORAL_TABLET | Freq: Every day | ORAL | 3 refills | Status: DC
Start: 2016-02-20 — End: 2017-08-29

## 2016-02-20 NOTE — Progress Notes (Addendum)
   BP 130/84 (BP Location: Left Arm, Cuff Size: Large)   Pulse 70   Temp 98.2 F (36.8 C)   Ht 5\' 8"  (1.727 m) Comment: pt had shoes on  Wt 189 lb 9.6 oz (86 kg) Comment: pt had shoes on  LMP  (LMP Unknown)   SpO2 99%   BMI 28.83 kg/m    Subjective:    Patient ID: Nichole Palmer, female    DOB: 27-May-1960, 55 y.o.   MRN: [id number]  HPI: Nichole Palmer is a 55 y.o. female  Chief Complaint  Patient presents with  . Hypertension  . Medication Refill    pt states she needs a refill on Flonase   Hypertension Using medications without difficulty Average home BPs   No problems or lightheadedness No chest pain with exertion or shortness of breath No Edema  Allergies Controlled with Flonase.  Needs refill  Relevant past medical, surgical, family and social history reviewed and updated as indicated. Interim medical history since our last visit reviewed. Allergies and medications reviewed and updated.  Review of Systems  Per HPI unless specifically indicated above     Objective:    BP 130/84 (BP Location: Left Arm, Cuff Size: Large)   Pulse 70   Temp 98.2 F (36.8 C)   Ht 5\' 8"  (1.727 m) Comment: pt had shoes on  Wt 189 lb 9.6 oz (86 kg) Comment: pt had shoes on  LMP  (LMP Unknown)   SpO2 99%   BMI 28.83 kg/m   Wt Readings from Last 3 Encounters:  02/20/16 189 lb 9.6 oz (86 kg)  09/19/15 182 lb 9.6 oz (82.8 kg)  08/24/15 185 lb 6.4 oz (84.1 kg)    Physical Exam  Constitutional: She is oriented to person, place, and time. She appears well-developed and well-nourished. No distress.  HENT:  Head: Normocephalic and atraumatic.  Eyes: Conjunctivae and lids are normal. Right eye exhibits no discharge. Left eye exhibits no discharge. No scleral icterus.  Neck: Normal range of motion. Neck supple. No JVD present. Carotid bruit is not present.  Cardiovascular: Normal rate, regular rhythm and normal heart sounds.   Pulmonary/Chest: Effort normal and breath sounds  normal.  Abdominal: Normal appearance. There is no splenomegaly or hepatomegaly.  Musculoskeletal: Normal range of motion.  Neurological: She is alert and oriented to person, place, and time.  Skin: Skin is warm, dry and intact. No rash noted. No pallor.  Psychiatric: She has a normal mood and affect. Her behavior is normal. Judgment and thought content normal.    Results for orders placed or performed in visit on 08/24/15  WET PREP FOR [place], YEAST, CLUE  Result Value Ref Range   Trichomonas Exam Negative Negative   Yeast Exam Negative Negative   Clue Cell Exam Negative Negative      Assessment & Plan:   Problem List Items Addressed This Visit      Unprioritized   Allergic rhinitis    Refill Flonase      Hypertension    Stable, continue present medications.        Relevant Medications   losartan-hydrochlorothiazide (HYZAAR) 100-25 MG tablet   amLODipine (NORVASC) 10 MG tablet    Other Visit Diagnoses   None.      Follow up plan: Return for physical.

## 2016-02-20 NOTE — Addendum Note (Signed)
Addended by: Kathrine Haddock on: 02/20/2016 04:29 PM   Modules accepted: Orders

## 2016-02-20 NOTE — Assessment & Plan Note (Signed)
Stable, continue present medications.   

## 2016-02-20 NOTE — Assessment & Plan Note (Signed)
Refill Flonase. 

## 2016-05-22 ENCOUNTER — Encounter: Payer: BLUE CROSS/BLUE SHIELD | Admitting: Unknown Physician Specialty

## 2017-03-13 ENCOUNTER — Ambulatory Visit (INDEPENDENT_AMBULATORY_CARE_PROVIDER_SITE_OTHER): Payer: BLUE CROSS/BLUE SHIELD | Admitting: Unknown Physician Specialty

## 2017-03-13 ENCOUNTER — Encounter: Payer: Self-pay | Admitting: Unknown Physician Specialty

## 2017-03-13 VITALS — BP 131/82 | HR 69 | Temp 98.0°F | Ht 66.8 in | Wt 186.5 lb

## 2017-03-13 DIAGNOSIS — Z0000 Encounter for general adult medical examination without abnormal findings: Secondary | ICD-10-CM | POA: Diagnosis not present

## 2017-03-13 DIAGNOSIS — I10 Essential (primary) hypertension: Secondary | ICD-10-CM

## 2017-03-13 NOTE — Assessment & Plan Note (Signed)
Stable, continue present medications.   

## 2017-03-13 NOTE — Progress Notes (Signed)
BP 131/82 (BP Location: Left Arm, Cuff Size: Normal)   Pulse 69   Temp 98 F (36.7 C) (Oral)   Ht 5' 6.8" (1.697 m)   Wt 186 lb 8 oz (84.6 kg)   LMP  (LMP Unknown)   SpO2 100%   BMI 29.39 kg/m    Subjective:    Patient ID: Nichole Palmer, female    DOB: [birth year], 56 y.o.   MRN: [id number]  HPI: Nichole Palmer is a 56 y.o. female  Chief Complaint  Patient presents with  . Annual Exam   Hypertension Using medications without difficulty Average home BPs Not checking   No problems or lightheadedness No chest pain with exertion or shortness of breath No Edema  Family History  Problem Relation Age of Onset  . Migraines Sister   . Seizures Daughter   . COPD Daughter   . Heart disease Maternal Grandmother    Social History   Socioeconomic History  . Marital status: Divorced    Spouse name: Not on file  . Number of children: Not on file  . Years of education: Not on file  . Highest education level: Not on file  Social Needs  . Financial resource strain: Not on file  . Food insecurity - worry: Not on file  . Food insecurity - inability: Not on file  . Transportation needs - medical: Not on file  . Transportation needs - non-medical: Not on file  Occupational History  . Not on file  Tobacco Use  . Smoking status: Never Smoker  . Smokeless tobacco: Never Used  Substance and Sexual Activity  . Alcohol use: No    Alcohol/week: 0.0 oz  . Drug use: No  . Sexual activity: Yes  Other Topics Concern  . Not on file  Social History Narrative  . Not on file   Past Medical History:  Diagnosis Date  . Hypertension    Past Surgical History:  Procedure Laterality Date  . ABDOMINAL HYSTERECTOMY     partial    Relevant past medical, surgical, family and social history reviewed and updated as indicated. Interim medical history since our last visit reviewed. Allergies and medications reviewed and updated.  Review of Systems  Per HPI unless specifically  indicated above     Objective:    BP 131/82 (BP Location: Left Arm, Cuff Size: Normal)   Pulse 69   Temp 98 F (36.7 C) (Oral)   Ht 5' 6.8" (1.697 m)   Wt 186 lb 8 oz (84.6 kg)   LMP  (LMP Unknown)   SpO2 100%   BMI 29.39 kg/m   Wt Readings from Last 3 Encounters:  03/13/17 186 lb 8 oz (84.6 kg)  02/20/16 189 lb 9.6 oz (86 kg)  09/19/15 182 lb 9.6 oz (82.8 kg)    Physical Exam  Constitutional: She is oriented to person, place, and time. She appears well-developed and well-nourished. No distress.  HENT:  Head: Normocephalic and atraumatic.  Eyes: Conjunctivae and lids are normal. Right eye exhibits no discharge. Left eye exhibits no discharge. No scleral icterus.  Neck: Normal range of motion. Neck supple. No JVD present. Carotid bruit is not present.  Cardiovascular: Normal rate, regular rhythm and normal heart sounds.  Pulmonary/Chest: Effort normal and breath sounds normal.  Abdominal: Normal appearance. There is no splenomegaly or hepatomegaly.  Musculoskeletal: Normal range of motion.  Neurological: She is alert and oriented to person, place, and time.  Skin: Skin is warm, dry and intact.  No rash noted. No pallor.  Psychiatric: She has a normal mood and affect. Her behavior is normal. Judgment and thought content normal.    Results for orders placed or performed in visit on 08/24/15  WET PREP FOR [place], YEAST, CLUE  Result Value Ref Range   Trichomonas Exam Negative Negative   Yeast Exam Negative Negative   Clue Cell Exam Negative Negative      Assessment & Plan:   Problem List Items Addressed This Visit      Unprioritized   Hypertension    Stable, continue present medications.         Other Visit Diagnoses    Annual physical exam    -  Primary   Relevant Orders   CBC with Differential/Platelet   Comprehensive metabolic panel   Lipid Panel w/o Chol/HDL Ratio   VITAMIN D 25 Hydroxy (Vit-D Deficiency, Fractures)   TSH   MM DIGITAL SCREENING BILATERAL       Health maintenance Refusing, influenza vaccine, Hep C, HIV Mammogram due 08/30/17 Td due 03/08/18 Colonoscopy due 6.2023  Follow up plan: Return in about 6 months (around 09/10/2017), or if symptoms worsen or fail to improve.

## 2017-03-13 NOTE — Patient Instructions (Addendum)
Please do call to schedule your mammogram; the number to schedule one at either [facility] or [facility] is [phone]   Preventive Care 40-64 Years, Female Preventive care refers to lifestyle choices and visits with your health care provider that can promote health and wellness. What does preventive care include?  A yearly physical exam. This is also called an annual well check.  Dental exams once or twice a year.  Routine eye exams. Ask your health care provider how often you should have your eyes checked.  Personal lifestyle choices, including: ? Daily care of your teeth and gums. ? Regular physical activity. ? Eating a healthy diet. ? Avoiding tobacco and drug use. ? Limiting alcohol use. ? Practicing safe sex. ? Taking low-dose aspirin daily starting at age 50. ? Taking vitamin and mineral supplements as recommended by your health care provider. What happens during an annual well check? The services and screenings done by your health care provider during your annual well check will depend on your age, overall health, lifestyle risk factors, and family history of disease. Counseling Your health care provider may ask you questions about your:  Alcohol use.  Tobacco use.  Drug use.  Emotional well-being.  Home and relationship well-being.  Sexual activity.  Eating habits.  Work and work environment.  Method of birth control.  Menstrual cycle.  Pregnancy history.  Screening You may have the following tests or measurements:  Height, weight, and BMI.  Blood pressure.  Lipid and cholesterol levels. These may be checked every 5 years, or more frequently if you are over 50 years old.  Skin check.  Lung cancer screening. You may have this screening every year starting at age 55 if you have a 30-pack-year history of smoking and currently smoke or have quit within the past 15 years.  Fecal occult blood test (FOBT) of the stool.  You may have this test every year starting at age 50.  Flexible sigmoidoscopy or colonoscopy. You may have a sigmoidoscopy every 5 years or a colonoscopy every 10 years starting at age 50.  Hepatitis C blood test.  Hepatitis B blood test.  Sexually transmitted disease (STD) testing.  Diabetes screening. This is done by checking your blood sugar (glucose) after you have not eaten for a while (fasting). You may have this done every 1-3 years.  Mammogram. This may be done every 1-2 years. Talk to your health care provider about when you should start having regular mammograms. This may depend on whether you have a family history of breast cancer.  BRCA-related cancer screening. This may be done if you have a family history of breast, ovarian, tubal, or peritoneal cancers.  Pelvic exam and Pap test. This may be done every 3 years starting at age 21. Starting at age 30, this may be done every 5 years if you have a Pap test in combination with an HPV test.  Bone density scan. This is done to screen for osteoporosis. You may have this scan if you are at high risk for osteoporosis.  Discuss your test results, treatment options, and if necessary, the need for more tests with your health care provider. Vaccines Your health care provider may recommend certain vaccines, such as:  Influenza vaccine. This is recommended every year.  Tetanus, diphtheria, and acellular pertussis (Tdap, Td) vaccine. You may need a Td booster every 10 years.  Varicella vaccine. You may need this if you have not been vaccinated.  Zoster vaccine. You may   need this after age 11.  Measles, mumps, and rubella (MMR) vaccine. You may need at least one dose of MMR if you were born in 1957 or later. You may also need a second dose.  Pneumococcal 13-valent conjugate (PCV13) vaccine. You may need this if you have certain conditions and were not previously vaccinated.  Pneumococcal polysaccharide (PPSV23) vaccine. You may need  one or two doses if you smoke cigarettes or if you have certain conditions.  Meningococcal vaccine. You may need this if you have certain conditions.  Hepatitis A vaccine. You may need this if you have certain conditions or if you travel or work in places where you may be exposed to hepatitis A.  Hepatitis B vaccine. You may need this if you have certain conditions or if you travel or work in places where you may be exposed to hepatitis B.  Haemophilus influenzae type b (Hib) vaccine. You may need this if you have certain conditions.  Talk to your health care provider about which screenings and vaccines you need and how often you need them. This information is not intended to replace advice given to you by your health care provider. Make sure you discuss any questions you have with your health care provider. Document Released: 05/13/2015 Document Revised: 01/04/2016 Document Reviewed: 02/15/2015 Elsevier Interactive Patient Education  2017 [employer].

## 2017-03-14 LAB — COMPREHENSIVE METABOLIC PANEL
ALT: 15 IU/L (ref 0–32)
AST: 19 IU/L (ref 0–40)
Albumin/Globulin Ratio: 1.5 (ref 1.2–2.2)
Albumin: 4.7 g/dL (ref 3.5–5.5)
Alkaline Phosphatase: 87 IU/L (ref 39–117)
BUN/Creatinine Ratio: 18 (ref 9–23)
BUN: 19 mg/dL (ref 6–24)
Bilirubin Total: <0.2 mg/dL (ref 0.0–1.2)
CALCIUM: 9.9 mg/dL (ref 8.7–10.2)
CO2: 26 mmol/L (ref 20–29)
Chloride: 102 mmol/L (ref 96–106)
Creatinine, Ser: 1.06 mg/dL — ABNORMAL HIGH (ref 0.57–1.00)
GFR, EST AFRICAN AMERICAN: 67 mL/min/{1.73_m2} (ref >59)
GFR, EST NON AFRICAN AMERICAN: 58 mL/min/{1.73_m2} — AB (ref >59)
GLUCOSE: 101 mg/dL — AB (ref 65–99)
Globulin, Total: 3.1 g/dL (ref 1.5–4.5)
Potassium: 3.9 mmol/L (ref 3.5–5.2)
Sodium: 143 mmol/L (ref 134–144)
TOTAL PROTEIN: 7.8 g/dL (ref 6.0–8.5)

## 2017-03-14 LAB — CBC WITH DIFFERENTIAL/PLATELET
BASOS ABS: 0 10*3/uL (ref 0.0–0.2)
Basos: 0 %
EOS (ABSOLUTE): 0.2 10*3/uL (ref 0.0–0.4)
Eos: 4 %
Hematocrit: 39 % (ref 34.0–46.6)
Hemoglobin: 12.9 g/dL (ref 11.1–15.9)
IMMATURE GRANS (ABS): 0 10*3/uL (ref 0.0–0.1)
IMMATURE GRANULOCYTES: 0 %
LYMPHS: 45 %
Lymphocytes Absolute: 2.2 10*3/uL (ref 0.7–3.1)
MCH: 27.9 pg (ref 26.6–33.0)
MCHC: 33.1 g/dL (ref 31.5–35.7)
MCV: 84 fL (ref 79–97)
Monocytes Absolute: 0.3 10*3/uL (ref 0.1–0.9)
Monocytes: 6 %
NEUTROS PCT: 45 %
Neutrophils Absolute: 2.2 10*3/uL (ref 1.4–7.0)
PLATELETS: 217 10*3/uL (ref 150–379)
RBC: 4.63 x10E6/uL (ref 3.77–5.28)
RDW: 14 % (ref 12.3–15.4)
WBC: 4.9 10*3/uL (ref 3.4–10.8)

## 2017-03-14 LAB — LIPID PANEL W/O CHOL/HDL RATIO
Cholesterol, Total: 162 mg/dL (ref 100–199)
HDL: 52 mg/dL (ref >39)
LDL Calculated: 86 mg/dL (ref 0–99)
TRIGLYCERIDES: 120 mg/dL (ref 0–149)
VLDL CHOLESTEROL CAL: 24 mg/dL (ref 5–40)

## 2017-03-14 LAB — VITAMIN D 25 HYDROXY (VIT D DEFICIENCY, FRACTURES): VIT D 25 HYDROXY: 29 ng/mL — AB (ref 30.0–100.0)

## 2017-03-14 LAB — TSH: TSH: 2.56 u[IU]/mL (ref 0.450–4.500)

## 2017-03-18 ENCOUNTER — Telehealth: Payer: Self-pay | Admitting: Unknown Physician Specialty

## 2017-03-18 NOTE — Telephone Encounter (Signed)
Lab results note read to pt. From Kathrine Haddock, NP.  Everything was normal.   Her kidney function is a little low.   She is to come in for another lab draw after drinking a lot of water.   No appt needed.  Pt stated,  "She would come in this Friday to have her lab work drawn".

## 2017-03-19 ENCOUNTER — Other Ambulatory Visit: Payer: Self-pay | Admitting: Unknown Physician Specialty

## 2017-03-19 DIAGNOSIS — R944 Abnormal results of kidney function studies: Secondary | ICD-10-CM

## 2017-08-29 ENCOUNTER — Other Ambulatory Visit: Payer: Self-pay | Admitting: Unknown Physician Specialty

## 2017-09-11 ENCOUNTER — Encounter: Payer: Self-pay | Admitting: Unknown Physician Specialty

## 2017-09-11 ENCOUNTER — Ambulatory Visit: Payer: BLUE CROSS/BLUE SHIELD | Admitting: Unknown Physician Specialty

## 2017-09-11 DIAGNOSIS — I10 Essential (primary) hypertension: Secondary | ICD-10-CM

## 2017-09-11 MED ORDER — AMLODIPINE BESYLATE 10 MG PO TABS: 10.0000 mg | ORAL_TABLET | Freq: Every day | ORAL | 3 refills | Status: DC

## 2017-09-11 NOTE — Patient Instructions (Signed)
Please do call to schedule your mammogram; the number to schedule one at either [facility] or [facility] is [phone]   

## 2017-09-11 NOTE — Assessment & Plan Note (Signed)
Not to goal but out of Amlodipine.  Will refill.  Recheck in 1 month

## 2017-09-11 NOTE — Progress Notes (Signed)
BP (!) 159/101   Pulse 68   Wt 195 lb 3.2 oz (88.5 kg)   LMP  (LMP Unknown)   SpO2 98%   BMI 30.76 kg/m    Subjective:    Patient ID: Nichole Palmer, female    DOB: [birth year], 57 y.o.   MRN: [id number]  HPI: Nichole Palmer is a 57 y.o. female  Chief Complaint  Patient presents with  . Follow-up  . Hypertension   Hypertension Pt states she has been our of Amlodipine for 2 weeks Using medications without difficulty Average home BPs Not checking   No problems or lightheadedness No chest pain with exertion or shortness of breath No Edema  Relevant past medical, surgical, family and social history reviewed and updated as indicated. Interim medical history since our last visit reviewed. Allergies and medications reviewed and updated.  Review of Systems  Per HPI unless specifically indicated above     Objective:    BP (!) 159/101   Pulse 68   Wt 195 lb 3.2 oz (88.5 kg)   LMP  (LMP Unknown)   SpO2 98%   BMI 30.76 kg/m   Wt Readings from Last 3 Encounters:  09/11/17 195 lb 3.2 oz (88.5 kg)  03/13/17 186 lb 8 oz (84.6 kg)  02/20/16 189 lb 9.6 oz (86 kg)    Physical Exam  Constitutional: She is oriented to person, place, and time. She appears well-developed and well-nourished. No distress.  HENT:  Head: Normocephalic and atraumatic.  Eyes: Conjunctivae and lids are normal. Right eye exhibits no discharge. Left eye exhibits no discharge. No scleral icterus.  Neck: Normal range of motion. Neck supple. No JVD present. Carotid bruit is not present.  Cardiovascular: Normal rate, regular rhythm and normal heart sounds.  Pulmonary/Chest: Effort normal and breath sounds normal.  Abdominal: Normal appearance. There is no splenomegaly or hepatomegaly.  Musculoskeletal: Normal range of motion.  Neurological: She is alert and oriented to person, place, and time.  Skin: Skin is warm, dry and intact. No rash noted. No pallor.  Psychiatric: She has a normal mood and  affect. Her behavior is normal. Judgment and thought content normal.    Results for orders placed or performed in visit on 03/13/17  CBC with Differential/Platelet  Result Value Ref Range   WBC 4.9 3.4 - 10.8 x10E3/uL   RBC 4.63 3.77 - 5.28 x10E6/uL   Hemoglobin 12.9 11.1 - 15.9 g/dL   Hematocrit 39.0 34.0 - 46.6 %   MCV 84 79 - 97 fL   MCH 27.9 26.6 - 33.0 pg   MCHC 33.1 31.5 - 35.7 g/dL   RDW 14.0 12.3 - 15.4 %   Platelets 217 150 - 379 x10E3/uL   Neutrophils 45 Not Estab. %   Lymphs 45 Not Estab. %   Monocytes 6 Not Estab. %   Eos 4 Not Estab. %   Basos 0 Not Estab. %   Neutrophils Absolute 2.2 1.4 - 7.0 x10E3/uL   Lymphocytes Absolute 2.2 0.7 - 3.1 x10E3/uL   Monocytes Absolute 0.3 0.1 - 0.9 x10E3/uL   EOS (ABSOLUTE) 0.2 0.0 - 0.4 x10E3/uL   Basophils Absolute 0.0 0.0 - 0.2 x10E3/uL   Immature Granulocytes 0 Not Estab. %   Immature Grans (Abs) 0.0 0.0 - 0.1 x10E3/uL  Comprehensive metabolic panel  Result Value Ref Range   Glucose 101 (H) 65 - 99 mg/dL   BUN 19 6 - 24 mg/dL   Creatinine, Ser 1.06 (H) 0.57 - 1.00 mg/dL  GFR calc non Af Amer 58 (L) >59 mL/min/1.73   GFR calc Af Amer 67 >59 mL/min/1.73   BUN/Creatinine Ratio 18 9 - 23   Sodium 143 134 - 144 mmol/L   Potassium 3.9 3.5 - 5.2 mmol/L   Chloride 102 96 - 106 mmol/L   CO2 26 20 - 29 mmol/L   Calcium 9.9 8.7 - 10.2 mg/dL   Total Protein 7.8 6.0 - 8.5 g/dL   Albumin 4.7 3.5 - 5.5 g/dL   Globulin, Total 3.1 1.5 - 4.5 g/dL   Albumin/Globulin Ratio 1.5 1.2 - 2.2   Bilirubin Total <0.2 0.0 - 1.2 mg/dL   Alkaline Phosphatase 87 39 - 117 IU/L   AST 19 0 - 40 IU/L   ALT 15 0 - 32 IU/L  Lipid Panel w/o Chol/HDL Ratio  Result Value Ref Range   Cholesterol, Total 162 100 - 199 mg/dL   Triglycerides 120 0 - 149 mg/dL   HDL 52 >39 mg/dL   VLDL Cholesterol Cal 24 5 - 40 mg/dL   LDL Calculated 86 0 - 99 mg/dL  VITAMIN D 25 Hydroxy (Vit-D Deficiency, Fractures)  Result Value Ref Range   Vit D, 25-Hydroxy 29.0 (L)  30.0 - 100.0 ng/mL  TSH  Result Value Ref Range   TSH 2.560 0.450 - 4.500 uIU/mL      Assessment & Plan:   Problem List Items Addressed This Visit      Unprioritized   Hypertension    Not to goal but out of Amlodipine.  Will refill.  Recheck in 1 month      Relevant Medications   amLODipine (NORVASC) 10 MG tablet       Follow up plan: Return in about 3 weeks (around 10/02/2017).

## 2017-10-19 ENCOUNTER — Other Ambulatory Visit: Payer: Self-pay | Admitting: Unknown Physician Specialty

## 2017-10-29 ENCOUNTER — Ambulatory Visit (INDEPENDENT_AMBULATORY_CARE_PROVIDER_SITE_OTHER): Payer: BLUE CROSS/BLUE SHIELD | Admitting: Unknown Physician Specialty

## 2017-10-29 ENCOUNTER — Encounter: Payer: Self-pay | Admitting: Unknown Physician Specialty

## 2017-10-29 VITALS — BP 124/70 | HR 69 | Temp 98.1°F | Ht 66.5 in | Wt 192.6 lb

## 2017-10-29 DIAGNOSIS — I10 Essential (primary) hypertension: Secondary | ICD-10-CM

## 2017-10-29 MED ORDER — LOSARTAN POTASSIUM-HCTZ 100-25 MG PO TABS: 1.0000 | ORAL_TABLET | Freq: Every day | ORAL | 0 refills | Status: DC

## 2017-10-29 MED ORDER — AMLODIPINE BESYLATE 10 MG PO TABS: 10.0000 mg | ORAL_TABLET | Freq: Every day | ORAL | 1 refills | Status: DC

## 2017-10-29 NOTE — Patient Instructions (Addendum)
Please do call to schedule your mammogram; the number to schedule one at either [facility] or [facility] is [phone]  Take 2,000 IUs/day of Vit D

## 2017-10-29 NOTE — Assessment & Plan Note (Signed)
BP 124/70 on recheck.  Low GFR last visit.  Check CMP.  Physical in 6 months

## 2017-10-29 NOTE — Progress Notes (Signed)
BP 124/70   Pulse 69   Temp 98.1 F (36.7 C) (Oral)   Ht 5' 6.5" (1.689 m)   Wt 192 lb 9.6 oz (87.4 kg)   LMP  (LMP Unknown)   SpO2 100%   BMI 30.62 kg/m    Subjective:    Patient ID: Nichole Palmer, female    DOB: 04-Oct-1960, 57 y.o.   MRN: [id number]  HPI: Nichole Palmer is a 57 y.o. female  Chief Complaint  Patient presents with  . Hypertension   Hypertension Using medications without difficulty.  Taking both Amlodipine and Losartan/HCTZ.  States she is under a lot of stress right now Average home BPs 130-140/80-88   No problems or lightheadedness No chest pain with exertion or shortness of breath No Edema   Relevant past medical, surgical, family and social history reviewed and updated as indicated. Interim medical history since our last visit reviewed. Allergies and medications reviewed and updated.  Review of Systems  Per HPI unless specifically indicated above     Objective:    BP 124/70   Pulse 69   Temp 98.1 F (36.7 C) (Oral)   Ht 5' 6.5" (1.689 m)   Wt 192 lb 9.6 oz (87.4 kg)   LMP  (LMP Unknown)   SpO2 100%   BMI 30.62 kg/m   Wt Readings from Last 3 Encounters:  10/29/17 192 lb 9.6 oz (87.4 kg)  09/11/17 195 lb 3.2 oz (88.5 kg)  03/13/17 186 lb 8 oz (84.6 kg)    Physical Exam  Constitutional: She is oriented to person, place, and time. She appears well-developed and well-nourished. No distress.  HENT:  Head: Normocephalic and atraumatic.  Eyes: Conjunctivae and lids are normal. Right eye exhibits no discharge. Left eye exhibits no discharge. No scleral icterus.  Neck: Normal range of motion. Neck supple. No JVD present. Carotid bruit is not present.  Cardiovascular: Normal rate, regular rhythm and normal heart sounds.  Pulmonary/Chest: Effort normal and breath sounds normal.  Abdominal: Normal appearance. There is no splenomegaly or hepatomegaly.  Musculoskeletal: Normal range of motion.  Neurological: She is alert and oriented to  person, place, and time.  Skin: Skin is warm, dry and intact. No rash noted. No pallor.  Psychiatric: She has a normal mood and affect. Her behavior is normal. Judgment and thought content normal.    Results for orders placed or performed in visit on 03/13/17  CBC with Differential/Platelet  Result Value Ref Range   WBC 4.9 3.4 - 10.8 x10E3/uL   RBC 4.63 3.77 - 5.28 x10E6/uL   Hemoglobin 12.9 11.1 - 15.9 g/dL   Hematocrit 39.0 34.0 - 46.6 %   MCV 84 79 - 97 fL   MCH 27.9 26.6 - 33.0 pg   MCHC 33.1 31.5 - 35.7 g/dL   RDW 14.0 12.3 - 15.4 %   Platelets 217 150 - 379 x10E3/uL   Neutrophils 45 Not Estab. %   Lymphs 45 Not Estab. %   Monocytes 6 Not Estab. %   Eos 4 Not Estab. %   Basos 0 Not Estab. %   Neutrophils Absolute 2.2 1.4 - 7.0 x10E3/uL   Lymphocytes Absolute 2.2 0.7 - 3.1 x10E3/uL   Monocytes Absolute 0.3 0.1 - 0.9 x10E3/uL   EOS (ABSOLUTE) 0.2 0.0 - 0.4 x10E3/uL   Basophils Absolute 0.0 0.0 - 0.2 x10E3/uL   Immature Granulocytes 0 Not Estab. %   Immature Grans (Abs) 0.0 0.0 - 0.1 x10E3/uL  Comprehensive metabolic panel  Result Value Ref Range   Glucose 101 (H) 65 - 99 mg/dL   BUN 19 6 - 24 mg/dL   Creatinine, Ser 1.06 (H) 0.57 - 1.00 mg/dL   GFR calc non Af Amer 58 (L) >59 mL/min/1.73   GFR calc Af Amer 67 >59 mL/min/1.73   BUN/Creatinine Ratio 18 9 - 23   Sodium 143 134 - 144 mmol/L   Potassium 3.9 3.5 - 5.2 mmol/L   Chloride 102 96 - 106 mmol/L   CO2 26 20 - 29 mmol/L   Calcium 9.9 8.7 - 10.2 mg/dL   Total Protein 7.8 6.0 - 8.5 g/dL   Albumin 4.7 3.5 - 5.5 g/dL   Globulin, Total 3.1 1.5 - 4.5 g/dL   Albumin/Globulin Ratio 1.5 1.2 - 2.2   Bilirubin Total <0.2 0.0 - 1.2 mg/dL   Alkaline Phosphatase 87 39 - 117 IU/L   AST 19 0 - 40 IU/L   ALT 15 0 - 32 IU/L  Lipid Panel w/o Chol/HDL Ratio  Result Value Ref Range   Cholesterol, Total 162 100 - 199 mg/dL   Triglycerides 120 0 - 149 mg/dL   HDL 52 >39 mg/dL   VLDL Cholesterol Cal 24 5 - 40 mg/dL   LDL  Calculated 86 0 - 99 mg/dL  VITAMIN D 25 Hydroxy (Vit-D Deficiency, Fractures)  Result Value Ref Range   Vit D, 25-Hydroxy 29.0 (L) 30.0 - 100.0 ng/mL  TSH  Result Value Ref Range   TSH 2.560 0.450 - 4.500 uIU/mL      Assessment & Plan:   Problem List Items Addressed This Visit      Unprioritized   Hypertension - Primary    BP 124/70 on recheck.  Low GFR last visit.  Check CMP.  Physical in 6 months      Relevant Orders   Comprehensive metabolic panel       Follow up plan: Return in about 6 months (around 05/01/2018).

## 2017-10-30 ENCOUNTER — Encounter: Payer: Self-pay | Admitting: Unknown Physician Specialty

## 2017-10-30 LAB — COMPREHENSIVE METABOLIC PANEL
A/G RATIO: 1.6 (ref 1.2–2.2)
ALT: 15 IU/L (ref 0–32)
AST: 18 IU/L (ref 0–40)
Albumin: 4.6 g/dL (ref 3.5–5.5)
Alkaline Phosphatase: 83 IU/L (ref 39–117)
BILIRUBIN TOTAL: 0.2 mg/dL (ref 0.0–1.2)
BUN / CREAT RATIO: 16 (ref 9–23)
BUN: 15 mg/dL (ref 6–24)
CHLORIDE: 100 mmol/L (ref 96–106)
CO2: 24 mmol/L (ref 20–29)
Calcium: 10 mg/dL (ref 8.7–10.2)
Creatinine, Ser: 0.96 mg/dL (ref 0.57–1.00)
GFR calc non Af Amer: 66 mL/min/{1.73_m2} (ref >59)
GFR, EST AFRICAN AMERICAN: 76 mL/min/{1.73_m2} (ref >59)
Globulin, Total: 2.8 g/dL (ref 1.5–4.5)
Glucose: 89 mg/dL (ref 65–99)
POTASSIUM: 3.8 mmol/L (ref 3.5–5.2)
Sodium: 138 mmol/L (ref 134–144)
TOTAL PROTEIN: 7.4 g/dL (ref 6.0–8.5)

## 2018-03-19 ENCOUNTER — Encounter: Payer: Self-pay | Admitting: Family Medicine

## 2018-05-23 ENCOUNTER — Encounter: Payer: BLUE CROSS/BLUE SHIELD | Admitting: Family Medicine

## 2018-07-29 ENCOUNTER — Other Ambulatory Visit: Payer: Self-pay | Admitting: Family Medicine

## 2018-07-29 NOTE — Telephone Encounter (Signed)
Requested medication (s) are due for refill today: Yes  Requested medication (s) are on the active medication list: Yes  Last refill:  2017  Future visit scheduled: No  Notes to clinic:  Expired, unable to refill     Requested Prescriptions  Pending Prescriptions Disp Refills   fluticasone (FLONASE) 50 MCG/ACT nasal spray 16 g 12    Sig: Place 2 sprays into both nostrils daily.     Ear, Nose, and Throat: Nasal Preparations - Corticosteroids Passed - 07/29/2018 12:50 PM      Passed - Valid encounter within last 12 months    Recent Outpatient Visits          9 months ago Essential hypertension   [facility] Kathrine Haddock, NP   10 months ago Essential hypertension   [place] Kathrine Haddock, NP   1 year ago Annual physical exam   [facility] Kathrine Haddock, NP   2 years ago Essential hypertension   [place] Kathrine Haddock, NP   2 years ago Essential hypertension   [facility] Kathrine Haddock, NP

## 2018-07-30 ENCOUNTER — Telehealth: Payer: Self-pay | Admitting: Family Medicine

## 2018-07-30 MED ORDER — FLUTICASONE PROPIONATE 50 MCG/ACT NA SUSP: 2.0000 | Freq: Every day | NASAL | 12 refills | Status: DC

## 2018-07-30 NOTE — Telephone Encounter (Signed)
Needs virtual visit w labs after or can see her if no uri sxs in clinic tomorrow or friday  Copied from [place] [id number]. Topic: General - Inquiry >> Jul 30, 2018  3:44 PM Rutherford Nail, NT wrote: Reason for CRM: Patient calling and is complaining of odor and pressure when she goes to urinate. Tried to call the office, but no answer. Patient would like to know if antibiotic could be sent or a call back with an appointment. Please advise. States that if an antibiotic is sent, would also need something for a yeast infection because every time she takes an antibiotic she gets a yeast infection. [id number] Lorina [address]on, [place]

## 2018-08-01 ENCOUNTER — Encounter: Payer: Self-pay | Admitting: Family Medicine

## 2018-08-01 ENCOUNTER — Other Ambulatory Visit: Payer: Self-pay

## 2018-08-01 ENCOUNTER — Ambulatory Visit: Payer: BLUE CROSS/BLUE SHIELD | Admitting: Family Medicine

## 2018-08-01 VITALS — BP 136/82 | HR 82 | Temp 98.1°F | Ht 67.0 in | Wt 190.0 lb

## 2018-08-01 DIAGNOSIS — Z114 Encounter for screening for human immunodeficiency virus [HIV]: Secondary | ICD-10-CM | POA: Diagnosis not present

## 2018-08-01 DIAGNOSIS — R300 Dysuria: Secondary | ICD-10-CM | POA: Diagnosis not present

## 2018-08-01 DIAGNOSIS — N390 Urinary tract infection, site not specified: Secondary | ICD-10-CM | POA: Diagnosis not present

## 2018-08-01 DIAGNOSIS — Z1159 Encounter for screening for other viral diseases: Secondary | ICD-10-CM

## 2018-08-01 DIAGNOSIS — Z23 Encounter for immunization: Secondary | ICD-10-CM | POA: Diagnosis not present

## 2018-08-01 MED ORDER — NITROFURANTOIN MONOHYD MACRO 100 MG PO CAPS: 100.0000 mg | ORAL_CAPSULE | Freq: Two times a day (BID) | ORAL | 0 refills | Status: DC

## 2018-08-01 MED ORDER — FLUCONAZOLE 150 MG PO TABS: 150.0000 mg | ORAL_TABLET | Freq: Once | ORAL | 0 refills | Status: AC

## 2018-08-01 NOTE — Progress Notes (Signed)
BP 136/82    Pulse 82    Temp 98.1 F (36.7 C) (Oral)    Ht 5\' 7"  (1.702 m)    Wt 190 lb (86.2 kg)    LMP  (LMP Unknown)    SpO2 98%    BMI 29.76 kg/m    Subjective:    Patient ID: Nichole Palmer, female    DOB: 1960/12/31, 58 y.o.   MRN: [id number]  HPI: Nichole Palmer is a 58 y.o. female  Chief Complaint  Patient presents with   Urinary Frequency    pt states feels pressure when urinatex about 2 weeks   Feeling some lower abdominal pressure for about 2 weeks now. Denies fevers, chills, N/V/D, back pain, hematuria, dysuria. Trying cranberry and lots of water with no relief. Gets UTIs about every other year it seems like.   Relevant past medical, surgical, family and social history reviewed and updated as indicated. Interim medical history since our last visit reviewed. Allergies and medications reviewed and updated.  Review of Systems  Per HPI unless specifically indicated above     Objective:    BP 136/82    Pulse 82    Temp 98.1 F (36.7 C) (Oral)    Ht 5\' 7"  (1.702 m)    Wt 190 lb (86.2 kg)    LMP  (LMP Unknown)    SpO2 98%    BMI 29.76 kg/m   Wt Readings from Last 3 Encounters:  08/01/18 190 lb (86.2 kg)  10/29/17 192 lb 9.6 oz (87.4 kg)  09/11/17 195 lb 3.2 oz (88.5 kg)    Physical Exam Vitals signs and nursing note reviewed.  Constitutional:      Appearance: Normal appearance. She is not ill-appearing.  HENT:     Head: Atraumatic.  Eyes:     Extraocular Movements: Extraocular movements intact.     Conjunctiva/sclera: Conjunctivae normal.  Neck:     Musculoskeletal: Normal range of motion and neck supple.  Cardiovascular:     Rate and Rhythm: Normal rate and regular rhythm.     Heart sounds: Normal heart sounds.  Pulmonary:     Effort: Pulmonary effort is normal.     Breath sounds: Normal breath sounds.  Abdominal:     Tenderness: There is abdominal tenderness (mildly ttp suprapubic area). There is no right CVA tenderness, left CVA tenderness,  guarding or rebound.  Musculoskeletal: Normal range of motion.  Skin:    General: Skin is warm and dry.  Neurological:     Mental Status: She is alert and oriented to person, place, and time.  Psychiatric:        Mood and Affect: Mood normal.        Thought Content: Thought content normal.        Judgment: Judgment normal.     Results for orders placed or performed in visit on 10/29/17  Comprehensive metabolic panel  Result Value Ref Range   Glucose 89 65 - 99 mg/dL   BUN 15 6 - 24 mg/dL   Creatinine, Ser 0.96 0.57 - 1.00 mg/dL   GFR calc non Af Amer 66 >59 mL/min/1.73   GFR calc Af Amer 76 >59 mL/min/1.73   BUN/Creatinine Ratio 16 9 - 23   Sodium 138 134 - 144 mmol/L   Potassium 3.8 3.5 - 5.2 mmol/L   Chloride 100 96 - 106 mmol/L   CO2 24 20 - 29 mmol/L   Calcium 10.0 8.7 - 10.2 mg/dL   Total Protein  7.4 6.0 - 8.5 g/dL   Albumin 4.6 3.5 - 5.5 g/dL   Globulin, Total 2.8 1.5 - 4.5 g/dL   Albumin/Globulin Ratio 1.6 1.2 - 2.2   Bilirubin Total 0.2 0.0 - 1.2 mg/dL   Alkaline Phosphatase 83 39 - 117 IU/L   AST 18 0 - 40 IU/L   ALT 15 0 - 32 IU/L      Assessment & Plan:   Problem List Items Addressed This Visit    None    Visit Diagnoses    Acute lower UTI    -  Primary   Tx with macrobid, probiotics, push fluids. Await cx. Diflucan sent as she states she gets yeast infections on abx   Relevant Medications   nitrofurantoin, macrocrystal-monohydrate, (MACROBID) 100 MG capsule   fluconazole (DIFLUCAN) 150 MG tablet   Other Relevant Orders   UA/M w/rflx Culture, Routine   Need for tetanus, diphtheria, and acellular pertussis (Tdap) vaccine       Relevant Orders   Tdap vaccine greater than or equal to 7yo IM (Completed)   Encounter for screening for HIV       Relevant Orders   HIV Antibody (routine testing w rflx)   Need for hepatitis C screening test       Relevant Orders   Hepatitis C antibody       Follow up plan: Return for CPE.

## 2018-08-02 LAB — HIV ANTIBODY (ROUTINE TESTING W REFLEX): HIV Screen 4th Generation wRfx: NONREACTIVE

## 2018-08-02 LAB — HEPATITIS C ANTIBODY: Hep C Virus Ab: <0.1 s/co ratio (ref 0.0–0.9)

## 2018-08-04 ENCOUNTER — Other Ambulatory Visit: Payer: Self-pay | Admitting: Family Medicine

## 2018-08-04 LAB — UA/M W/RFLX CULTURE, ROUTINE
Bilirubin, UA: NEGATIVE
Glucose, UA: NEGATIVE
Nitrite, UA: NEGATIVE
Protein,UA: NEGATIVE
Specific Gravity, UA: 1.025 (ref 1.005–1.030)
Urobilinogen, Ur: 0.2 mg/dL (ref 0.2–1.0)
pH, UA: 5 (ref 5.0–7.5)

## 2018-08-04 LAB — MICROSCOPIC EXAMINATION

## 2018-08-04 LAB — URINE CULTURE, REFLEX

## 2018-08-04 MED ORDER — CIPROFLOXACIN HCL 250 MG PO TABS: 250.0000 mg | ORAL_TABLET | Freq: Two times a day (BID) | ORAL | 0 refills | Status: DC

## 2018-09-27 ENCOUNTER — Other Ambulatory Visit: Payer: Self-pay | Admitting: Unknown Physician Specialty

## 2018-09-29 NOTE — Telephone Encounter (Signed)
Patient is significantly overdue for follow up visit, it's been about a year since she's been seen for chronic condition management and it appears she should have been out of her medication for about 9 months. Please schedule her for follow up or CPE and I will bridge her until then

## 2018-09-30 NOTE — Telephone Encounter (Signed)
Called patient, no answer, unable to leave a message, will try again.   

## 2018-10-01 MED ORDER — AMLODIPINE BESYLATE 10 MG PO TABS: 10.0000 mg | ORAL_TABLET | Freq: Every day | ORAL | 0 refills | Status: DC

## 2018-10-01 MED ORDER — LOSARTAN POTASSIUM-HCTZ 100-25 MG PO TABS: 1.0000 | ORAL_TABLET | Freq: Every day | ORAL | 0 refills | Status: DC

## 2018-10-01 NOTE — Addendum Note (Signed)
Addended by: Merrie Roof E on: 10/01/2018 11:59 AM   Modules accepted: Orders

## 2018-10-01 NOTE — Telephone Encounter (Signed)
1 month supply of BP medications sent

## 2018-10-01 NOTE — Telephone Encounter (Signed)
Pt has an appt 10/03/2018.

## 2018-10-03 ENCOUNTER — Other Ambulatory Visit: Payer: Self-pay

## 2018-10-03 ENCOUNTER — Encounter: Payer: Self-pay | Admitting: Family Medicine

## 2018-10-03 ENCOUNTER — Ambulatory Visit (INDEPENDENT_AMBULATORY_CARE_PROVIDER_SITE_OTHER): Payer: BC Managed Care – PPO | Admitting: Family Medicine

## 2018-10-03 VITALS — BP 178/103 | HR 65 | Temp 97.9°F

## 2018-10-03 DIAGNOSIS — I10 Essential (primary) hypertension: Secondary | ICD-10-CM | POA: Diagnosis not present

## 2018-10-03 MED ORDER — HYDRALAZINE HCL 25 MG PO TABS: 25.0000 mg | ORAL_TABLET | Freq: Two times a day (BID) | ORAL | 0 refills | Status: DC

## 2018-10-03 NOTE — Progress Notes (Signed)
BP (!) 178/103   Pulse 65   Temp 97.9 F (36.6 C) (Oral)   LMP  (LMP Unknown)   SpO2 97%    Subjective:    Patient ID: Nichole Palmer, female    DOB: [birth year], 59 y.o.   MRN: [id number]  HPI: Nichole Palmer is a 57 y.o. female  Chief Complaint  Patient presents with  . Hypertension   Here today for HTN f/u. Has not been checking home readings lately. Taking both BP medications faithfully without side effects. States she can tell her readings have been high lately just by how she feels. Denies CP, SOB, HAs, blurry vision, dizziness. Does state she's been under a lot of stress lately and feels that is contributing.   Relevant past medical, surgical, family and social history reviewed and updated as indicated. Interim medical history since our last visit reviewed. Allergies and medications reviewed and updated.  Review of Systems  Per HPI unless specifically indicated above     Objective:    BP (!) 178/103   Pulse 65   Temp 97.9 F (36.6 C) (Oral)   LMP  (LMP Unknown)   SpO2 97%   Wt Readings from Last 3 Encounters:  08/01/18 190 lb (86.2 kg)  10/29/17 192 lb 9.6 oz (87.4 kg)  09/11/17 195 lb 3.2 oz (88.5 kg)    Physical Exam Vitals signs and nursing note reviewed.  Constitutional:      Appearance: Normal appearance. She is not ill-appearing.  HENT:     Head: Atraumatic.  Eyes:     Extraocular Movements: Extraocular movements intact.     Conjunctiva/sclera: Conjunctivae normal.  Neck:     Musculoskeletal: Normal range of motion and neck supple.  Cardiovascular:     Rate and Rhythm: Normal rate and regular rhythm.     Heart sounds: Normal heart sounds.  Pulmonary:     Effort: Pulmonary effort is normal.     Breath sounds: Normal breath sounds.  Musculoskeletal: Normal range of motion.  Skin:    General: Skin is warm and dry.  Neurological:     Mental Status: She is alert and oriented to person, place, and time.  Psychiatric:        Mood and  Affect: Mood normal.        Thought Content: Thought content normal.        Judgment: Judgment normal.     Results for orders placed or performed in visit on 08/01/18  Microscopic Examination  Result Value Ref Range   WBC, UA 11-30 (A) 0 - 5 /hpf   RBC 0-2 0 - 2 /hpf   Epithelial Cells (non renal) 0-10 0 - 10 /hpf   Bacteria, UA Moderate (A) None seen/Few  Urine Culture, Reflex  Result Value Ref Range   Urine Culture, Routine Final report (A)    Organism ID, Bacteria Proteus mirabilis (A)    Antimicrobial Susceptibility Comment   UA/M w/rflx Culture, Routine  Result Value Ref Range   Specific Gravity, UA 1.025 1.005 - 1.030   pH, UA 5.0 5.0 - 7.5   Color, UA Yellow Yellow   Appearance Ur Hazy (A) Clear   Leukocytes,UA 2+ (A) Negative   Protein,UA Negative Negative/Trace   Glucose, UA Negative Negative   Ketones, UA Trace (A) Negative   RBC, UA 1+ (A) Negative   Bilirubin, UA Negative Negative   Urobilinogen, Ur 0.2 0.2 - 1.0 mg/dL   Nitrite, UA Negative Negative   Microscopic Examination  See below:    Urinalysis Reflex Comment   HIV Antibody (routine testing w rflx)  Result Value Ref Range   HIV Screen 4th Generation wRfx Non Reactive Non Reactive  Hepatitis C antibody  Result Value Ref Range   Hep C Virus Ab <0.1 0.0 - 0.9 s/co ratio      Assessment & Plan:   Problem List Items Addressed This Visit      Cardiovascular and Mediastinum   Hypertension - Primary    BPs not at goal on high dose amlodipine, losartan and HCTZ. HRs appear to trend around 60-70 so will avoid BBlockers. Add hydralazine 25 mg BID and begin monitoring and logging home readings twice daily. DASH diet, stress reduction techniques reviewed. F/u if abnormal readings in meantime      Relevant Medications   hydrALAZINE (APRESOLINE) 25 MG tablet       Follow up plan: Return in about 2 weeks (around 10/17/2018) for BP.

## 2018-10-03 NOTE — Assessment & Plan Note (Signed)
BPs not at goal on high dose amlodipine, losartan and HCTZ. HRs appear to trend around 60-70 so will avoid BBlockers. Add hydralazine 25 mg BID and begin monitoring and logging home readings twice daily. DASH diet, stress reduction techniques reviewed. F/u if abnormal readings in meantime

## 2018-10-17 ENCOUNTER — Ambulatory Visit: Payer: BC Managed Care – PPO | Admitting: Family Medicine

## 2018-10-26 ENCOUNTER — Other Ambulatory Visit: Payer: Self-pay | Admitting: Family Medicine

## 2018-10-26 NOTE — Telephone Encounter (Signed)
Requested Prescriptions  Pending Prescriptions Disp Refills  . hydrALAZINE (APRESOLINE) 25 MG tablet [Pharmacy Med Name: HYDRALAZINE 25 MG TABLET] 60 tablet 0    Sig: TAKE 1 TABLET BY MOUTH TWICE A DAY     Cardiovascular:  Vasodilators Failed - 10/26/2018 12:48 AM      Failed - HCT in normal range and within 360 days    Hematocrit  Date Value Ref Range Status  03/13/2017 39.0 34.0 - 46.6 % Final         Failed - HGB in normal range and within 360 days    Hemoglobin  Date Value Ref Range Status  03/13/2017 12.9 11.1 - 15.9 g/dL Final         Failed - WBC in normal range and within 360 days    WBC  Date Value Ref Range Status  03/13/2017 4.9 3.4 - 10.8 x10E3/uL Final         Failed - PLT in normal range and within 360 days    Platelets  Date Value Ref Range Status  03/13/2017 217 150 - 379 x10E3/uL Final         Failed - Last BP in normal range    BP Readings from Last 1 Encounters:  10/03/18 (!) 178/103         Passed - RBC in normal range and within 360 days    RBC  Date Value Ref Range Status  03/13/2017 4.63 3.77 - 5.28 x10E6/uL Final         Passed - Valid encounter within last 12 months    Recent Outpatient Visits          3 weeks ago Essential hypertension   [facility] [place], [place]   2 months ago Acute lower UTI   [facility] [place], [place]   12 months ago Essential hypertension   [place], [place], [place]   1 year ago Essential hypertension   [place], [place], NP   1 year ago Annual physical exam   [facility] Kathrine Haddock, NP             . losartan-hydrochlorothiazide (HYZAAR) 100-25 MG tablet [Pharmacy Med Name: LOSARTAN-HCTZ 100-25 MG TAB] 30 tablet 0    Sig: TAKE 1 TABLET BY MOUTH EVERY DAY     Cardiovascular: ARB + Diuretic Combos Failed - 10/26/2018 12:48 AM      Failed - K in normal range and within 180 days    Potassium  Date  Value Ref Range Status  10/29/2017 3.8 3.5 - 5.2 mmol/L Final         Failed - Na in normal range and within 180 days    Sodium  Date Value Ref Range Status  10/29/2017 138 134 - 144 mmol/L Final         Failed - Cr in normal range and within 180 days    Creatinine, Ser  Date Value Ref Range Status  10/29/2017 0.96 0.57 - 1.00 mg/dL Final         Failed - Ca in normal range and within 180 days    Calcium  Date Value Ref Range Status  10/29/2017 10.0 8.7 - 10.2 mg/dL Final         Failed - Last BP in normal range    BP Readings from Last 1 Encounters:  10/03/18 (!) 178/103         Passed - Patient is not pregnant  Passed - Valid encounter within last 6 months    Recent Outpatient Visits          3 weeks ago Essential hypertension   [facility] Merrie Roof [place], [place]   2 months ago Acute lower UTI   [place], [place], [place]   12 months ago Essential hypertension   [place] Kathrine Haddock, NP   1 year ago Essential hypertension   [place] Kathrine Haddock, NP   1 year ago Annual physical exam   [facility] Kathrine Haddock, NP

## 2019-07-01 ENCOUNTER — Ambulatory Visit: Payer: Self-pay | Attending: Internal Medicine

## 2019-07-02 ENCOUNTER — Encounter: Payer: Self-pay | Admitting: Family Medicine

## 2019-07-02 ENCOUNTER — Ambulatory Visit (INDEPENDENT_AMBULATORY_CARE_PROVIDER_SITE_OTHER): Payer: No Typology Code available for payment source | Admitting: Family Medicine

## 2019-07-02 ENCOUNTER — Other Ambulatory Visit: Payer: Self-pay

## 2019-07-02 VITALS — BP 137/90 | HR 65 | Temp 98.4°F | Ht 67.0 in | Wt 193.0 lb

## 2019-07-02 DIAGNOSIS — Z1239 Encounter for other screening for malignant neoplasm of breast: Secondary | ICD-10-CM | POA: Diagnosis not present

## 2019-07-02 DIAGNOSIS — N760 Acute vaginitis: Secondary | ICD-10-CM

## 2019-07-02 DIAGNOSIS — B9689 Other specified bacterial agents as the cause of diseases classified elsewhere: Secondary | ICD-10-CM

## 2019-07-02 LAB — WET PREP FOR TRICH, YEAST, CLUE
Clue Cell Exam: POSITIVE — AB
Trichomonas Exam: POSITIVE — AB
Yeast Exam: NEGATIVE

## 2019-07-02 MED ORDER — METRONIDAZOLE 500 MG PO TABS: 500.0000 mg | ORAL_TABLET | Freq: Two times a day (BID) | ORAL | 0 refills | Status: DC

## 2019-07-02 NOTE — Patient Instructions (Signed)
Please call this number to schedule your mammogram. [phone] 

## 2019-07-02 NOTE — Progress Notes (Signed)
BP 137/90   Pulse 65   Temp 98.4 F (36.9 C) (Oral)   Ht 5\' 7"  (1.702 m)   Wt 193 lb (87.5 kg)   LMP  (LMP Unknown)   SpO2 98%   BMI 30.23 kg/m    Subjective:    Patient ID: Nichole Palmer, female    DOB: Nov 15, 1960, 59 y.o.   MRN: [id number]  HPI: Nichole Palmer is a 59 y.o. female  Chief Complaint  Patient presents with  . Vaginal Discharge    itching and strong odor x over a week   Vaginal discharge and odor that started about a week ago. Denies fevers, N/V, abdominal pain, dysuria, hematuria. Not trying anything OTC for sxs. Tends to get BV every year and this feels similar. Has changed soaps recently and feels that could have caused it.   Relevant past medical, surgical, family and social history reviewed and updated as indicated. Interim medical history since our last visit reviewed. Allergies and medications reviewed and updated.  Review of Systems  Per HPI unless specifically indicated above     Objective:    BP 137/90   Pulse 65   Temp 98.4 F (36.9 C) (Oral)   Ht 5\' 7"  (1.702 m)   Wt 193 lb (87.5 kg)   LMP  (LMP Unknown)   SpO2 98%   BMI 30.23 kg/m   Wt Readings from Last 3 Encounters:  07/02/19 193 lb (87.5 kg)  08/01/18 190 lb (86.2 kg)  10/29/17 192 lb 9.6 oz (87.4 kg)    Physical Exam Vitals and nursing note reviewed.  Constitutional:      Appearance: Normal appearance. She is not ill-appearing.  HENT:     Head: Atraumatic.  Eyes:     Extraocular Movements: Extraocular movements intact.     Conjunctiva/sclera: Conjunctivae normal.  Cardiovascular:     Rate and Rhythm: Normal rate and regular rhythm.     Heart sounds: Normal heart sounds.  Pulmonary:     Effort: Pulmonary effort is normal.     Breath sounds: Normal breath sounds.  Abdominal:     General: Bowel sounds are normal. There is no distension.     Palpations: Abdomen is soft.     Tenderness: There is no abdominal tenderness. There is no right CVA tenderness, left CVA  tenderness or guarding.  Genitourinary:    Vagina: Vaginal discharge present.  Musculoskeletal:        General: Normal range of motion.     Cervical back: Normal range of motion and neck supple.  Skin:    General: Skin is warm and dry.  Neurological:     Mental Status: She is alert and oriented to person, place, and time.  Psychiatric:        Mood and Affect: Mood normal.        Thought Content: Thought content normal.        Judgment: Judgment normal.     Results for orders placed or performed in visit on 07/02/19  WET PREP FOR [place], YEAST, CLUE   Specimen: Vaginal Fluid   VAGINAL FLUI  Result Value Ref Range   Trichomonas Exam Positive (A) Negative   Yeast Exam Negative Negative   Clue Cell Exam Positive (A) Negative  Microscopic Examination   URINE  Result Value Ref Range   WBC, UA 11-30 (A) 0 - 5 /hpf   RBC None seen 0 - 2 /hpf   Epithelial Cells (non renal) 0-10 0 - 10 /  hpf   Bacteria, UA None seen None seen/Few   Trichomonas, UA Present None seen  Urine Culture, Reflex   URINE  Result Value Ref Range   Urine Culture, Routine Final report (A)    Organism ID, Bacteria Comment (A)   UA/M w/rflx Culture, Routine   Specimen: Urine   URINE  Result Value Ref Range   Specific Gravity, UA 1.020 1.005 - 1.030   pH, UA 7.5 5.0 - 7.5   Color, UA Yellow Yellow   Appearance Ur Clear Clear   Leukocytes,UA 3+ (A) Negative   Protein,UA Negative Negative/Trace   Glucose, UA Negative Negative   Ketones, UA Negative Negative   RBC, UA Negative Negative   Bilirubin, UA Negative Negative   Urobilinogen, Ur 0.2 0.2 - 1.0 mg/dL   Nitrite, UA Negative Negative   Microscopic Examination See below:    Urinalysis Reflex Comment       Assessment & Plan:   Problem List Items Addressed This Visit    None    Visit Diagnoses    BV (bacterial vaginosis)    -  Primary   Tx with flagyl, probotics, good vaginal hygiene. F/u if not resolving   Relevant Medications   metroNIDAZOLE  (FLAGYL) 500 MG tablet   Other Relevant Orders   UA/M w/rflx Culture, Routine (Completed)   WET PREP FOR [place], [place], [place] (Completed)   Encounter for screening for malignant neoplasm of breast, unspecified screening modality       Relevant Orders   MM DIGITAL SCREENING BILATERAL       Follow up plan: Return if symptoms worsen or fail to improve.

## 2019-07-04 LAB — URINE CULTURE, REFLEX

## 2019-07-04 LAB — UA/M W/RFLX CULTURE, ROUTINE
Bilirubin, UA: NEGATIVE
Glucose, UA: NEGATIVE
Ketones, UA: NEGATIVE
Nitrite, UA: NEGATIVE
Protein,UA: NEGATIVE
RBC, UA: NEGATIVE
Specific Gravity, UA: 1.02 (ref 1.005–1.030)
Urobilinogen, Ur: 0.2 mg/dL (ref 0.2–1.0)
pH, UA: 7.5 (ref 5.0–7.5)

## 2019-07-04 LAB — MICROSCOPIC EXAMINATION
Bacteria, UA: NONE SEEN
RBC, Urine: NONE SEEN /hpf (ref 0–2)

## 2019-07-06 ENCOUNTER — Encounter: Payer: BC Managed Care – PPO | Admitting: Family Medicine

## 2019-07-09 ENCOUNTER — Ambulatory Visit (INDEPENDENT_AMBULATORY_CARE_PROVIDER_SITE_OTHER): Payer: No Typology Code available for payment source | Admitting: Family Medicine

## 2019-07-09 ENCOUNTER — Other Ambulatory Visit: Payer: Self-pay

## 2019-07-09 ENCOUNTER — Encounter: Payer: Self-pay | Admitting: Family Medicine

## 2019-07-09 VITALS — BP 136/89 | HR 66 | Temp 98.5°F | Ht 67.5 in | Wt 194.0 lb

## 2019-07-09 DIAGNOSIS — I10 Essential (primary) hypertension: Secondary | ICD-10-CM | POA: Diagnosis not present

## 2019-07-09 DIAGNOSIS — J301 Allergic rhinitis due to pollen: Secondary | ICD-10-CM

## 2019-07-09 DIAGNOSIS — Z0000 Encounter for general adult medical examination without abnormal findings: Secondary | ICD-10-CM

## 2019-07-09 MED ORDER — LOSARTAN POTASSIUM-HCTZ 100-25 MG PO TABS: 1.0000 | ORAL_TABLET | Freq: Every day | ORAL | 1 refills | Status: DC

## 2019-07-09 MED ORDER — AMLODIPINE BESYLATE 10 MG PO TABS: 10.0000 mg | ORAL_TABLET | Freq: Every day | ORAL | 1 refills | Status: DC

## 2019-07-09 NOTE — Progress Notes (Signed)
BP 136/89   Pulse 66   Temp 98.5 F (36.9 C) (Oral)   Ht 5' 7.5" (1.715 m)   Wt 194 lb (88 kg)   LMP  (LMP Unknown)   SpO2 99%   BMI 29.94 kg/m    Subjective:    Patient ID: Nichole Palmer, female    DOB: 1961-02-19, 59 y.o.   MRN: [id number]  HPI: Nichole Palmer is a 59 y.o. female presenting on 07/09/2019 for comprehensive medical examination. Current medical complaints include:see below  HTN - Home BPs 130s/80s, every once in a while if stressed will be a bit higher but overall doing well on medications. Denies CP, SOB, HAs, dizziness, side effects.   Allergic rhinitis - on flonase prn, no breakthrough sxs.   She currently lives with: Menopausal Symptoms: no  Depression Screen done today and results listed below:  Depression screen [facility] 2/9 07/09/2019 03/13/2017 11/16/2014  Decreased Interest 0 0 0  Down, Depressed, Hopeless 0 0 0  PHQ - 2 Score 0 0 0  Altered sleeping 1 0 -  Tired, decreased energy 0 0 -  Change in appetite 0 0 -  Feeling bad or failure about yourself  0 0 -  Trouble concentrating 0 0 -  Moving slowly or fidgety/restless 0 0 -  Suicidal thoughts 0 0 -  PHQ-9 Score 1 0 -    The patient does not have a history of falls. I did complete a risk assessment for falls. A plan of care for falls was documented.   Past Medical History:  Past Medical History:  Diagnosis Date  . Hypertension     Surgical History:  Past Surgical History:  Procedure Laterality Date  . ABDOMINAL HYSTERECTOMY     partial    Medications:  Current Outpatient Medications on File Prior to Visit  Medication Sig  . fluticasone (FLONASE) 50 MCG/ACT nasal spray Place 2 sprays into both nostrils daily.   No current facility-administered medications on file prior to visit.    Allergies:  Allergies  Allergen Reactions  . Sulfa Antibiotics   . Sulfacetamide Sodium     Social History:  Social History   Socioeconomic History  . Marital status: Divorced    Spouse  name: Not on file  . Number of children: Not on file  . Years of education: Not on file  . Highest education level: Not on file  Occupational History  . Not on file  Tobacco Use  . Smoking status: Never Smoker  . Smokeless tobacco: Never Used  Substance and Sexual Activity  . Alcohol use: No    Alcohol/week: 0.0 standard drinks  . Drug use: No  . Sexual activity: Yes  Other Topics Concern  . Not on file  Social History Narrative  . Not on file   Social Determinants of Health   Financial Resource Strain:   . Difficulty of Paying Living Expenses:   Food Insecurity:   . Worried About Charity fundraiser in the Last Year:   . Arboriculturist in the Last Year:   Transportation Needs:   . Film/video editor (Medical):   Marland Kitchen Lack of Transportation (Non-Medical):   Physical Activity:   . Days of Exercise per Week:   . Minutes of Exercise per Session:   Stress:   . Feeling of Stress :   Social Connections:   . Frequency of Communication with Friends and Family:   . Frequency of Social Gatherings with Friends and  Family:   . Attends Religious Services:   . Active Member of Clubs or Organizations:   . Attends Archivist Meetings:   Marland Kitchen Marital Status:   Intimate Partner Violence:   . Fear of Current or Ex-Partner:   . Emotionally Abused:   Marland Kitchen Physically Abused:   . Sexually Abused:    Social History   Tobacco Use  Smoking Status Never Smoker  Smokeless Tobacco Never Used   Social History   Substance and Sexual Activity  Alcohol Use No  . Alcohol/week: 0.0 standard drinks    Family History:  Family History  Problem Relation Age of Onset  . Migraines Sister   . Seizures Daughter   . COPD Daughter   . Heart disease Maternal Grandmother     Past medical history, surgical history, medications, allergies, family history and social history reviewed with patient today and changes made to appropriate areas of the chart.   Review of Systems - General ROS:  negative Psychological ROS: negative Ophthalmic ROS: negative ENT ROS: negative Allergy and Immunology ROS: negative Hematological and Lymphatic ROS: negative Endocrine ROS: negative Breast ROS: negative for breast lumps Respiratory ROS: no cough, shortness of breath, or wheezing Cardiovascular ROS: no chest pain or dyspnea on exertion Gastrointestinal ROS: no abdominal pain, change in bowel habits, or black or bloody stools Genito-Urinary ROS: no dysuria, trouble voiding, or hematuria Musculoskeletal ROS: negative Neurological ROS: no TIA or stroke symptoms Dermatological ROS: negative All other ROS negative except what is listed above and in the HPI.      Objective:    BP 136/89   Pulse 66   Temp 98.5 F (36.9 C) (Oral)   Ht 5' 7.5" (1.715 m)   Wt 194 lb (88 kg)   LMP  (LMP Unknown)   SpO2 99%   BMI 29.94 kg/m   Wt Readings from Last 3 Encounters:  07/09/19 194 lb (88 kg)  07/02/19 193 lb (87.5 kg)  08/01/18 190 lb (86.2 kg)    Physical Exam Vitals and nursing note reviewed.  Constitutional:      General: She is not in acute distress.    Appearance: She is well-developed.  HENT:     Head: Atraumatic.     Right Ear: External ear normal.     Left Ear: External ear normal.     Nose: Nose normal.     Mouth/Throat:     Pharynx: No oropharyngeal exudate.  Eyes:     General: No scleral icterus.    Conjunctiva/sclera: Conjunctivae normal.     Pupils: Pupils are equal, round, and reactive to light.  Neck:     Thyroid: No thyromegaly.  Cardiovascular:     Rate and Rhythm: Normal rate and regular rhythm.     Heart sounds: Normal heart sounds.  Pulmonary:     Effort: Pulmonary effort is normal. No respiratory distress.     Breath sounds: Normal breath sounds.  Chest:     Breasts:        Right: No mass, skin change or tenderness.        Left: No mass, skin change or tenderness.  Abdominal:     General: Bowel sounds are normal.     Palpations: Abdomen is soft.  There is no mass.     Tenderness: There is no abdominal tenderness.  Genitourinary:    Comments: GU exam declined Musculoskeletal:        General: No tenderness. Normal range of motion.  Cervical back: Normal range of motion and neck supple.  Lymphadenopathy:     Cervical: No cervical adenopathy.     Upper Body:     Right upper body: No axillary adenopathy.     Left upper body: No axillary adenopathy.  Skin:    General: Skin is warm and dry.     Findings: No rash.  Neurological:     Mental Status: She is alert and oriented to person, place, and time.     Cranial Nerves: No cranial nerve deficit.  Psychiatric:        Behavior: Behavior normal.     Results for orders placed or performed in visit on 07/09/19  Microscopic Examination   URINE  Result Value Ref Range   WBC, UA 0-5 0 - 5 /hpf   RBC None seen 0 - 2 /hpf   Epithelial Cells (non renal) 0-10 0 - 10 /hpf   Bacteria, UA Few (A) None seen/Few  Urine Culture, Reflex   URINE  Result Value Ref Range   Urine Culture, Routine Final report (A)    Organism ID, Bacteria Comment (A)   CBC with Differential/Platelet  Result Value Ref Range   WBC 3.6 3.4 - 10.8 x10E3/uL   RBC 4.51 3.77 - 5.28 x10E6/uL   Hemoglobin 12.6 11.1 - 15.9 g/dL   Hematocrit 37.8 34.0 - 46.6 %   MCV 84 79 - 97 fL   MCH 27.9 26.6 - 33.0 pg   MCHC 33.3 31.5 - 35.7 g/dL   RDW 13.8 11.7 - 15.4 %   Platelets 197 150 - 450 x10E3/uL   Neutrophils 39 Not Estab. %   Lymphs 47 Not Estab. %   Monocytes 8 Not Estab. %   Eos 5 Not Estab. %   Basos 1 Not Estab. %   Neutrophils Absolute 1.4 1.4 - 7.0 x10E3/uL   Lymphocytes Absolute 1.7 0.7 - 3.1 x10E3/uL   Monocytes Absolute 0.3 0.1 - 0.9 x10E3/uL   EOS (ABSOLUTE) 0.2 0.0 - 0.4 x10E3/uL   Basophils Absolute 0.0 0.0 - 0.2 x10E3/uL   Immature Granulocytes 0 Not Estab. %   Immature Grans (Abs) 0.0 0.0 - 0.1 x10E3/uL  Comprehensive metabolic panel  Result Value Ref Range   Glucose 83 65 - 99 mg/dL   BUN  14 6 - 24 mg/dL   Creatinine, Ser 1.00 0.57 - 1.00 mg/dL   GFR calc non Af Amer 62 >59 mL/min/1.73   GFR calc Af Amer 71 >59 mL/min/1.73   BUN/Creatinine Ratio 14 9 - 23   Sodium 139 134 - 144 mmol/L   Potassium 4.1 3.5 - 5.2 mmol/L   Chloride 100 96 - 106 mmol/L   CO2 24 20 - 29 mmol/L   Calcium 9.7 8.7 - 10.2 mg/dL   Total Protein 7.1 6.0 - 8.5 g/dL   Albumin 4.5 3.8 - 4.9 g/dL   Globulin, Total 2.6 1.5 - 4.5 g/dL   Albumin/Globulin Ratio 1.7 1.2 - 2.2   Bilirubin Total 0.2 0.0 - 1.2 mg/dL   Alkaline Phosphatase 82 39 - 117 IU/L   AST 19 0 - 40 IU/L   ALT 15 0 - 32 IU/L  Lipid Panel w/o Chol/HDL Ratio  Result Value Ref Range   Cholesterol, Total 128 100 - 199 mg/dL   Triglycerides 67 0 - 149 mg/dL   HDL 59 >39 mg/dL   VLDL Cholesterol Cal 14 5 - 40 mg/dL   LDL Chol Calc (NIH) 55 0 - 99 mg/dL  TSH  Result Value Ref Range   TSH 3.290 0.450 - 4.500 uIU/mL  UA/M w/rflx Culture, Routine   Specimen: Urine   URINE  Result Value Ref Range   Specific Gravity, UA 1.020 1.005 - 1.030   pH, UA 5.5 5.0 - 7.5   Color, UA Yellow Yellow   Appearance Ur Clear Clear   Leukocytes,UA 1+ (A) Negative   Protein,UA Negative Negative/Trace   Glucose, UA Negative Negative   Ketones, UA Negative Negative   RBC, UA Negative Negative   Bilirubin, UA Negative Negative   Urobilinogen, Ur 0.2 0.2 - 1.0 mg/dL   Nitrite, UA Negative Negative   Microscopic Examination See below:    Urinalysis Reflex Comment       Assessment & Plan:   Problem List Items Addressed This Visit      Cardiovascular and Mediastinum   Hypertension - Primary    BPs stable and under good control, continue current regimen      Relevant Medications   amLODipine (NORVASC) 10 MG tablet   losartan-hydrochlorothiazide (HYZAAR) 100-25 MG tablet   Other Relevant Orders   CBC with Differential/Platelet (Completed)   Comprehensive metabolic panel (Completed)   UA/M w/rflx Culture, Routine (Completed)     Respiratory    Allergic rhinitis    Stable and well controlled, continue current regimen       Other Visit Diagnoses    Annual physical exam       Relevant Orders   Lipid Panel w/o Chol/HDL Ratio (Completed)   TSH (Completed)       Follow up plan: Return in about 6 months (around 01/09/2020) for 6 month f/u.   LABORATORY TESTING:  - Pap smear: not applicable  IMMUNIZATIONS:   - Tdap: Tetanus vaccination status reviewed: last tetanus booster within 10 years. - Influenza: Refused  SCREENING: -Mammogram: Ordered today  - Colonoscopy: Up to date   PATIENT COUNSELING:   Advised to take 1 mg of folate supplement per day if capable of pregnancy.   Sexuality: Discussed sexually transmitted diseases, partner selection, use of condoms, avoidance of unintended pregnancy  and contraceptive alternatives.   Advised to avoid cigarette smoking.  I discussed with the patient that most people either abstain from alcohol or drink within safe limits (<=14/week and <=4 drinks/occasion for males, <=7/weeks and <= 3 drinks/occasion for females) and that the risk for alcohol disorders and other health effects rises proportionally with the number of drinks per week and how often a drinker exceeds daily limits.  Discussed cessation/primary prevention of drug use and availability of treatment for abuse.   Diet: Encouraged to adjust caloric intake to maintain  or achieve ideal body weight, to reduce intake of dietary saturated fat and total fat, to limit sodium intake by avoiding high sodium foods and not adding table salt, and to maintain adequate dietary potassium and calcium preferably from fresh fruits, vegetables, and low-fat dairy products.    stressed the importance of regular exercise  Injury prevention: Discussed safety belts, safety helmets, smoke detector, smoking near bedding or upholstery.   Dental health: Discussed importance of regular tooth brushing, flossing, and dental visits.    NEXT  PREVENTATIVE PHYSICAL DUE IN 1 YEAR. Return in about 6 months (around 01/09/2020) for 6 month f/u.

## 2019-07-10 LAB — LIPID PANEL W/O CHOL/HDL RATIO
Cholesterol, Total: 128 mg/dL (ref 100–199)
HDL: 59 mg/dL (ref >39)
LDL Chol Calc (NIH): 55 mg/dL (ref 0–99)
Triglycerides: 67 mg/dL (ref 0–149)
VLDL Cholesterol Cal: 14 mg/dL (ref 5–40)

## 2019-07-10 LAB — CBC WITH DIFFERENTIAL/PLATELET
Basophils Absolute: 0 10*3/uL (ref 0.0–0.2)
Basos: 1 %
EOS (ABSOLUTE): 0.2 10*3/uL (ref 0.0–0.4)
Eos: 5 %
Hematocrit: 37.8 % (ref 34.0–46.6)
Hemoglobin: 12.6 g/dL (ref 11.1–15.9)
Immature Grans (Abs): 0 10*3/uL (ref 0.0–0.1)
Immature Granulocytes: 0 %
Lymphocytes Absolute: 1.7 10*3/uL (ref 0.7–3.1)
Lymphs: 47 %
MCH: 27.9 pg (ref 26.6–33.0)
MCHC: 33.3 g/dL (ref 31.5–35.7)
MCV: 84 fL (ref 79–97)
Monocytes Absolute: 0.3 10*3/uL (ref 0.1–0.9)
Monocytes: 8 %
Neutrophils Absolute: 1.4 10*3/uL (ref 1.4–7.0)
Neutrophils: 39 %
Platelets: 197 10*3/uL (ref 150–450)
RBC: 4.51 x10E6/uL (ref 3.77–5.28)
RDW: 13.8 % (ref 11.7–15.4)
WBC: 3.6 10*3/uL (ref 3.4–10.8)

## 2019-07-10 LAB — COMPREHENSIVE METABOLIC PANEL
ALT: 15 IU/L (ref 0–32)
AST: 19 IU/L (ref 0–40)
Albumin/Globulin Ratio: 1.7 (ref 1.2–2.2)
Albumin: 4.5 g/dL (ref 3.8–4.9)
Alkaline Phosphatase: 82 IU/L (ref 39–117)
BUN/Creatinine Ratio: 14 (ref 9–23)
BUN: 14 mg/dL (ref 6–24)
Bilirubin Total: 0.2 mg/dL (ref 0.0–1.2)
CO2: 24 mmol/L (ref 20–29)
Calcium: 9.7 mg/dL (ref 8.7–10.2)
Chloride: 100 mmol/L (ref 96–106)
Creatinine, Ser: 1 mg/dL (ref 0.57–1.00)
GFR calc Af Amer: 71 mL/min/{1.73_m2} (ref >59)
GFR calc non Af Amer: 62 mL/min/{1.73_m2} (ref >59)
Globulin, Total: 2.6 g/dL (ref 1.5–4.5)
Glucose: 83 mg/dL (ref 65–99)
Potassium: 4.1 mmol/L (ref 3.5–5.2)
Sodium: 139 mmol/L (ref 134–144)
Total Protein: 7.1 g/dL (ref 6.0–8.5)

## 2019-07-10 LAB — TSH: TSH: 3.29 u[IU]/mL (ref 0.450–4.500)

## 2019-07-11 ENCOUNTER — Encounter: Payer: Self-pay | Admitting: Family Medicine

## 2019-07-11 LAB — UA/M W/RFLX CULTURE, ROUTINE
Bilirubin, UA: NEGATIVE
Glucose, UA: NEGATIVE
Ketones, UA: NEGATIVE
Nitrite, UA: NEGATIVE
Protein,UA: NEGATIVE
RBC, UA: NEGATIVE
Specific Gravity, UA: 1.02 (ref 1.005–1.030)
Urobilinogen, Ur: 0.2 mg/dL (ref 0.2–1.0)
pH, UA: 5.5 (ref 5.0–7.5)

## 2019-07-11 LAB — MICROSCOPIC EXAMINATION: RBC, Urine: NONE SEEN /hpf (ref 0–2)

## 2019-07-11 LAB — URINE CULTURE, REFLEX

## 2019-07-14 NOTE — Assessment & Plan Note (Signed)
Stable and well controlled, continue current regimen 

## 2019-07-14 NOTE — Assessment & Plan Note (Signed)
BPs stable and under good control, continue current regimen 

## 2019-10-01 ENCOUNTER — Ambulatory Visit (INDEPENDENT_AMBULATORY_CARE_PROVIDER_SITE_OTHER): Payer: No Typology Code available for payment source | Admitting: Family Medicine

## 2019-10-01 ENCOUNTER — Other Ambulatory Visit: Payer: Self-pay

## 2019-10-01 ENCOUNTER — Encounter: Payer: Self-pay | Admitting: Family Medicine

## 2019-10-01 VITALS — BP 171/103 | HR 55 | Temp 98.6°F | Wt 193.0 lb

## 2019-10-01 DIAGNOSIS — L309 Dermatitis, unspecified: Secondary | ICD-10-CM | POA: Diagnosis not present

## 2019-10-01 MED ORDER — CLOBETASOL PROPIONATE 0.05 % EX OINT: 1.0000 "application " | TOPICAL_OINTMENT | Freq: Two times a day (BID) | CUTANEOUS | 1 refills | Status: DC

## 2019-10-01 NOTE — Progress Notes (Signed)
BP (!) 171/103   Pulse (!) 55   Temp 98.6 F (37 C) (Oral)   Wt 193 lb (87.5 kg)   LMP  (LMP Unknown)   SpO2 99%   BMI 29.78 kg/m    Subjective:    Patient ID: Nichole Palmer, female    DOB: 06/17/1960, 59 y.o.   MRN: [id number]  HPI: Nichole Palmer is a 59 y.o. female  Chief Complaint  Patient presents with  . Rash    bilateral arms and hands x about 3 weeks. has tried OTC exederm lotion   Presenting today with rash b/l hands for 3 weeks. Peeling, painful, itchy. Hx of hand ezcema, followed typically by Dermatology but could not get in for an appt for a few weeks. Using some OTC hydrocortisone cream without much benefit and using aquaphor twice daily on area. Trying to wear gloves at work to cut down on the need for hand washing.   Relevant past medical, surgical, family and social history reviewed and updated as indicated. Interim medical history since our last visit reviewed. Allergies and medications reviewed and updated.  Review of Systems  Per HPI unless specifically indicated above     Objective:    BP (!) 171/103   Pulse (!) 55   Temp 98.6 F (37 C) (Oral)   Wt 193 lb (87.5 kg)   LMP  (LMP Unknown)   SpO2 99%   BMI 29.78 kg/m   Wt Readings from Last 3 Encounters:  10/01/19 193 lb (87.5 kg)  07/09/19 194 lb (88 kg)  07/02/19 193 lb (87.5 kg)    Physical Exam Vitals and nursing note reviewed.  Constitutional:      Appearance: Normal appearance. She is not ill-appearing.  HENT:     Head: Atraumatic.  Eyes:     Extraocular Movements: Extraocular movements intact.     Conjunctiva/sclera: Conjunctivae normal.  Cardiovascular:     Rate and Rhythm: Normal rate and regular rhythm.     Heart sounds: Normal heart sounds.  Pulmonary:     Effort: Pulmonary effort is normal.     Breath sounds: Normal breath sounds.  Musculoskeletal:        General: Normal range of motion.     Cervical back: Normal range of motion and neck supple.  Skin:    General:  Skin is warm and dry.     Comments: Peeling, cracked skin on hands b/l  Neurological:     Mental Status: She is alert and oriented to person, place, and time.  Psychiatric:        Mood and Affect: Mood normal.        Thought Content: Thought content normal.        Judgment: Judgment normal.     Results for orders placed or performed in visit on 07/09/19  Microscopic Examination   URINE  Result Value Ref Range   WBC, UA 0-5 0 - 5 /hpf   RBC None seen 0 - 2 /hpf   Epithelial Cells (non renal) 0-10 0 - 10 /hpf   Bacteria, UA Few (A) None seen/Few  Urine Culture, Reflex   URINE  Result Value Ref Range   Urine Culture, Routine Final report (A)    Organism ID, Bacteria Comment (A)   CBC with Differential/Platelet  Result Value Ref Range   WBC 3.6 3.4 - 10.8 x10E3/uL   RBC 4.51 3.77 - 5.28 x10E6/uL   Hemoglobin 12.6 11.1 - 15.9 g/dL   Hematocrit 37.8  34.0 - 46.6 %   MCV 84 79 - 97 fL   MCH 27.9 26.6 - 33.0 pg   MCHC 33.3 31.5 - 35.7 g/dL   RDW 13.8 11.7 - 15.4 %   Platelets 197 150 - 450 x10E3/uL   Neutrophils 39 Not Estab. %   Lymphs 47 Not Estab. %   Monocytes 8 Not Estab. %   Eos 5 Not Estab. %   Basos 1 Not Estab. %   Neutrophils Absolute 1.4 1.4 - 7.0 x10E3/uL   Lymphocytes Absolute 1.7 0.7 - 3.1 x10E3/uL   Monocytes Absolute 0.3 0.1 - 0.9 x10E3/uL   EOS (ABSOLUTE) 0.2 0.0 - 0.4 x10E3/uL   Basophils Absolute 0.0 0.0 - 0.2 x10E3/uL   Immature Granulocytes 0 Not Estab. %   Immature Grans (Abs) 0.0 0.0 - 0.1 x10E3/uL  Comprehensive metabolic panel  Result Value Ref Range   Glucose 83 65 - 99 mg/dL   BUN 14 6 - 24 mg/dL   Creatinine, Ser 1.00 0.57 - 1.00 mg/dL   GFR calc non Af Amer 62 >59 mL/min/1.73   GFR calc Af Amer 71 >59 mL/min/1.73   BUN/Creatinine Ratio 14 9 - 23   Sodium 139 134 - 144 mmol/L   Potassium 4.1 3.5 - 5.2 mmol/L   Chloride 100 96 - 106 mmol/L   CO2 24 20 - 29 mmol/L   Calcium 9.7 8.7 - 10.2 mg/dL   Total Protein 7.1 6.0 - 8.5 g/dL   Albumin  4.5 3.8 - 4.9 g/dL   Globulin, Total 2.6 1.5 - 4.5 g/dL   Albumin/Globulin Ratio 1.7 1.2 - 2.2   Bilirubin Total 0.2 0.0 - 1.2 mg/dL   Alkaline Phosphatase 82 39 - 117 IU/L   AST 19 0 - 40 IU/L   ALT 15 0 - 32 IU/L  Lipid Panel w/o Chol/HDL Ratio  Result Value Ref Range   Cholesterol, Total 128 100 - 199 mg/dL   Triglycerides 67 0 - 149 mg/dL   HDL 59 >39 mg/dL   VLDL Cholesterol Cal 14 5 - 40 mg/dL   LDL Chol Calc (NIH) 55 0 - 99 mg/dL  TSH  Result Value Ref Range   TSH 3.290 0.450 - 4.500 uIU/mL  UA/M w/rflx Culture, Routine   Specimen: Urine   URINE  Result Value Ref Range   Specific Gravity, UA 1.020 1.005 - 1.030   pH, UA 5.5 5.0 - 7.5   Color, UA Yellow Yellow   Appearance Ur Clear Clear   Leukocytes,UA 1+ (A) Negative   Protein,UA Negative Negative/Trace   Glucose, UA Negative Negative   Ketones, UA Negative Negative   RBC, UA Negative Negative   Bilirubin, UA Negative Negative   Urobilinogen, Ur 0.2 0.2 - 1.0 mg/dL   Nitrite, UA Negative Negative   Microscopic Examination See below:    Urinalysis Reflex Comment       Assessment & Plan:   Problem List Items Addressed This Visit    None    Visit Diagnoses    Eczema, unspecified type    -  Primary   Tx with clobetasol, continued good moisturizing regimen BID, avoiding excess handwashing, hot water, scented products. F/u with Dermatology as scheduled       Follow up plan: Return for as scheduled.

## 2020-01-11 ENCOUNTER — Ambulatory Visit: Payer: No Typology Code available for payment source | Admitting: Family Medicine

## 2020-09-28 ENCOUNTER — Other Ambulatory Visit: Payer: Self-pay

## 2020-09-29 ENCOUNTER — Ambulatory Visit (INDEPENDENT_AMBULATORY_CARE_PROVIDER_SITE_OTHER): Payer: 59 | Admitting: Nurse Practitioner

## 2020-09-29 ENCOUNTER — Encounter: Payer: Self-pay | Admitting: Nurse Practitioner

## 2020-09-29 VITALS — BP 158/94 | HR 68 | Ht 65.95 in | Wt 191.5 lb

## 2020-09-29 DIAGNOSIS — Z1231 Encounter for screening mammogram for malignant neoplasm of breast: Secondary | ICD-10-CM | POA: Diagnosis not present

## 2020-09-29 DIAGNOSIS — R8271 Bacteriuria: Secondary | ICD-10-CM

## 2020-09-29 DIAGNOSIS — I10 Essential (primary) hypertension: Secondary | ICD-10-CM

## 2020-09-29 DIAGNOSIS — Z0000 Encounter for general adult medical examination without abnormal findings: Secondary | ICD-10-CM

## 2020-09-29 LAB — URINALYSIS, ROUTINE W REFLEX MICROSCOPIC
Bilirubin, UA: NEGATIVE
Glucose, UA: NEGATIVE
Ketones, UA: NEGATIVE
Leukocytes,UA: NEGATIVE
Nitrite, UA: NEGATIVE
Protein,UA: NEGATIVE
Specific Gravity, UA: 1.025 (ref 1.005–1.030)
Urobilinogen, Ur: 1 mg/dL (ref 0.2–1.0)
pH, UA: 5.5 (ref 5.0–7.5)

## 2020-09-29 LAB — MICROSCOPIC EXAMINATION: WBC, UA: NONE SEEN /hpf (ref 0–5)

## 2020-09-29 MED ORDER — AMLODIPINE BESYLATE 10 MG PO TABS: 10.0000 mg | ORAL_TABLET | Freq: Every day | ORAL | 1 refills | Status: DC

## 2020-09-29 MED ORDER — LOSARTAN POTASSIUM-HCTZ 100-25 MG PO TABS: 1.0000 | ORAL_TABLET | Freq: Every day | ORAL | 1 refills | Status: DC

## 2020-09-29 NOTE — Assessment & Plan Note (Signed)
Chronic. Uncontrolled. Patient has not been taking her amlodipine.  Agrees to restart medication.  Recommend checking blood pressures are home and bringing log to follow up.  Refills sent.  Labs ordered today.  Follow up in 1 month.

## 2020-09-29 NOTE — Addendum Note (Signed)
Addended by: Gerrit Halls L on: 09/29/2020 05:02 PM   Modules accepted: Orders

## 2020-09-29 NOTE — Progress Notes (Signed)
BP (!) 158/94   Pulse 68   Ht 5' 5.95" (1.675 m)   Wt 191 lb 8 oz (86.9 kg)   LMP  (LMP Unknown)   SpO2 100%   BMI 30.96 kg/m    Subjective:    Patient ID: Nichole Palmer, female    DOB: 1960/11/16, 60 y.o.   MRN: [id number]  HPI: Nichole Palmer is a 60 y.o. female presenting on 09/29/2020 for comprehensive medical examination. Current medical complaints include:none  She currently lives with: Menopausal Symptoms: no  HYPERTENSION  Patient states she thinks the amlodipine was causing her to have come back discomfort.  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  She has also been very stressed with work.  Hypertension status: controlled  Satisfied with current treatment? no Duration of hypertension: years BP monitoring frequency:  not checking BP range:  BP medication side effects:  yes Medication compliance: excellent compliance Previous BP meds:amlodipine, HCTZ and losartan (cozaar) Aspirin: no Recurrent headaches: no Visual changes: no Palpitations: no Dyspnea: no Chest pain: no Lower extremity edema: no Dizzy/lightheaded: no  Depression Screen done today and results listed below:  Depression screen [facility] 2/9 09/29/2020 07/09/2019 03/13/2017 11/16/2014  Decreased Interest 0 0 0 0  Down, Depressed, Hopeless 0 0 0 0  PHQ - 2 Score 0 0 0 0  Altered sleeping - 1 0 -  Tired, decreased energy - 0 0 -  Change in appetite - 0 0 -  Feeling bad or failure about yourself  - 0 0 -  Trouble concentrating - 0 0 -  Moving slowly or fidgety/restless - 0 0 -  Suicidal thoughts - 0 0 -  PHQ-9 Score - 1 0 -    The patient does not have a history of falls. I did complete a risk assessment for falls. A plan of care for falls was documented.   Past Medical History:  Past Medical History:  Diagnosis Date  . Hypertension     Surgical History:  Past Surgical History:  Procedure Laterality Date  . ABDOMINAL HYSTERECTOMY     partial     Medications:  Current Outpatient Medications on File Prior to Visit  Medication Sig  . clobetasol ointment (TEMOVATE) 8.36 % Apply 1 application topically 2 (two) times daily.  . diphenhydrAMINE HCl (BENADRYL ALLERGY PO) Take by mouth daily.  . fluticasone (FLONASE) 50 MCG/ACT nasal spray Place 2 sprays into both nostrils daily.   No current facility-administered medications on file prior to visit.    Allergies:  Allergies  Allergen Reactions  . Sulfa Antibiotics   . Sulfacetamide Sodium     Social History:  Social History   Socioeconomic History  . Marital status: Divorced    Spouse name: Not on file  . Number of children: Not on file  . Years of education: Not on file  . Highest education level: Not on file  Occupational History  . Not on file  Tobacco Use  . Smoking status: Never Smoker  . Smokeless tobacco: Never Used  Vaping Use  . Vaping Use: Never used  Substance and Sexual Activity  . Alcohol use: No    Alcohol/week: 0.0 standard drinks  . Drug use: No  . Sexual activity: Yes  Other Topics Concern  . Not on file  Social History Narrative  . Not on file   Social Determinants of Health   Financial Resource Strain: Not on file  Food Insecurity: Not on  file  Transportation Needs: Not on file  Physical Activity: Not on file  Stress: Not on file  Social Connections: Not on file  Intimate Partner Violence: Not on file   Social History   Tobacco Use  Smoking Status Never Smoker  Smokeless Tobacco Never Used   Social History   Substance and Sexual Activity  Alcohol Use No  . Alcohol/week: 0.0 standard drinks    Family History:  Family History  Problem Relation Age of Onset  . Migraines Sister   . Seizures Daughter   . COPD Daughter   . Heart disease Maternal Grandmother     Past medical history, surgical history, medications, allergies, family history and social history reviewed with patient today and changes made to appropriate areas of  the chart.   Review of Systems  Eyes: Negative for blurred vision and double vision.  Respiratory: Negative for shortness of breath.   Cardiovascular: Negative for chest pain, palpitations and leg swelling.  Neurological: Negative for dizziness and headaches.   All other ROS negative except what is listed above and in the HPI.      Objective:    BP (!) 158/94   Pulse 68   Ht 5' 5.95" (1.675 m)   Wt 191 lb 8 oz (86.9 kg)   LMP  (LMP Unknown)   SpO2 100%   BMI 30.96 kg/m   Wt Readings from Last 3 Encounters:  09/29/20 191 lb 8 oz (86.9 kg)  10/01/19 193 lb (87.5 kg)  07/09/19 194 lb (88 kg)    Physical Exam Vitals and nursing note reviewed.  Constitutional:      General: She is awake. She is not in acute distress.    Appearance: She is well-developed. She is not ill-appearing.  HENT:     Head: Normocephalic and atraumatic.     Right Ear: Hearing, ear canal and external ear normal. No drainage. A middle ear effusion is present.     Left Ear: Hearing, ear canal and external ear normal. No drainage. A middle ear effusion is present.     Nose: Nose normal.     Right Sinus: No maxillary sinus tenderness or frontal sinus tenderness.     Left Sinus: No maxillary sinus tenderness or frontal sinus tenderness.     Mouth/Throat:     Mouth: Mucous membranes are moist.     Pharynx: Oropharynx is clear. Uvula midline. No pharyngeal swelling, oropharyngeal exudate or posterior oropharyngeal erythema.  Eyes:     General: Lids are normal.        Right eye: No discharge.        Left eye: No discharge.     Extraocular Movements: Extraocular movements intact.     Conjunctiva/sclera: Conjunctivae normal.     Pupils: Pupils are equal, round, and reactive to light.     Visual Fields: Right eye visual fields normal and left eye visual fields normal.  Neck:     Thyroid: No thyromegaly.     Vascular: No carotid bruit.     Trachea: Trachea normal.  Cardiovascular:     Rate and Rhythm: Normal  rate and regular rhythm.     Heart sounds: Normal heart sounds. No murmur heard. No gallop.   Pulmonary:     Effort: Pulmonary effort is normal. No accessory muscle usage or respiratory distress.     Breath sounds: Normal breath sounds.  Chest:  Breasts:     Right: Normal. No axillary adenopathy or supraclavicular adenopathy.  Left: Normal. No axillary adenopathy or supraclavicular adenopathy.    Abdominal:     General: Bowel sounds are normal.     Palpations: Abdomen is soft. There is no hepatomegaly or splenomegaly.     Tenderness: There is no abdominal tenderness.  Musculoskeletal:        General: Normal range of motion.     Cervical back: Normal range of motion and neck supple.     Right lower leg: No edema.     Left lower leg: No edema.  Lymphadenopathy:     Head:     Right side of head: No submental, submandibular, tonsillar, preauricular or posterior auricular adenopathy.     Left side of head: No submental, submandibular, tonsillar, preauricular or posterior auricular adenopathy.     Cervical: No cervical adenopathy.     Upper Body:     Right upper body: No supraclavicular, axillary or pectoral adenopathy.     Left upper body: No supraclavicular, axillary or pectoral adenopathy.  Skin:    General: Skin is warm and dry.     Capillary Refill: Capillary refill takes less than 2 seconds.     Findings: No rash.  Neurological:     Mental Status: She is alert and oriented to person, place, and time.     Cranial Nerves: Cranial nerves are intact.     Gait: Gait is intact.     Deep Tendon Reflexes: Reflexes are normal and symmetric.     Reflex Scores:      Brachioradialis reflexes are 2+ on the right side and 2+ on the left side.      Patellar reflexes are 2+ on the right side and 2+ on the left side. Psychiatric:        Attention and Perception: Attention normal.        Mood and Affect: Mood normal.        Speech: Speech normal.        Behavior: Behavior normal.  Behavior is cooperative.        Thought Content: Thought content normal.        Judgment: Judgment normal.     Results for orders placed or performed in visit on 07/09/19  Microscopic Examination   URINE  Result Value Ref Range   WBC, UA 0-5 0 - 5 /hpf   RBC None seen 0 - 2 /hpf   Epithelial Cells (non renal) 0-10 0 - 10 /hpf   Bacteria, UA Few (A) None seen/Few  Urine Culture, Reflex   URINE  Result Value Ref Range   Urine Culture, Routine Final report (A)    Organism ID, Bacteria Comment (A)   CBC with Differential/Platelet  Result Value Ref Range   WBC 3.6 3.4 - 10.8 x10E3/uL   RBC 4.51 3.77 - 5.28 x10E6/uL   Hemoglobin 12.6 11.1 - 15.9 g/dL   Hematocrit 37.8 34.0 - 46.6 %   MCV 84 79 - 97 fL   MCH 27.9 26.6 - 33.0 pg   MCHC 33.3 31.5 - 35.7 g/dL   RDW 13.8 11.7 - 15.4 %   Platelets 197 150 - 450 x10E3/uL   Neutrophils 39 Not Estab. %   Lymphs 47 Not Estab. %   Monocytes 8 Not Estab. %   Eos 5 Not Estab. %   Basos 1 Not Estab. %   Neutrophils Absolute 1.4 1.4 - 7.0 x10E3/uL   Lymphocytes Absolute 1.7 0.7 - 3.1 x10E3/uL   Monocytes Absolute 0.3 0.1 - 0.9 x10E3/uL  EOS (ABSOLUTE) 0.2 0.0 - 0.4 x10E3/uL   Basophils Absolute 0.0 0.0 - 0.2 x10E3/uL   Immature Granulocytes 0 Not Estab. %   Immature Grans (Abs) 0.0 0.0 - 0.1 x10E3/uL  Comprehensive metabolic panel  Result Value Ref Range   Glucose 83 65 - 99 mg/dL   BUN 14 6 - 24 mg/dL   Creatinine, Ser 1.00 0.57 - 1.00 mg/dL   GFR calc non Af Amer 62 >59 mL/min/1.73   GFR calc Af Amer 71 >59 mL/min/1.73   BUN/Creatinine Ratio 14 9 - 23   Sodium 139 134 - 144 mmol/L   Potassium 4.1 3.5 - 5.2 mmol/L   Chloride 100 96 - 106 mmol/L   CO2 24 20 - 29 mmol/L   Calcium 9.7 8.7 - 10.2 mg/dL   Total Protein 7.1 6.0 - 8.5 g/dL   Albumin 4.5 3.8 - 4.9 g/dL   Globulin, Total 2.6 1.5 - 4.5 g/dL   Albumin/Globulin Ratio 1.7 1.2 - 2.2   Bilirubin Total 0.2 0.0 - 1.2 mg/dL   Alkaline Phosphatase 82 39 - 117 IU/L   AST 19 0 -  40 IU/L   ALT 15 0 - 32 IU/L  Lipid Panel w/o Chol/HDL Ratio  Result Value Ref Range   Cholesterol, Total 128 100 - 199 mg/dL   Triglycerides 67 0 - 149 mg/dL   HDL 59 >39 mg/dL   VLDL Cholesterol Cal 14 5 - 40 mg/dL   LDL Chol Calc (NIH) 55 0 - 99 mg/dL  TSH  Result Value Ref Range   TSH 3.290 0.450 - 4.500 uIU/mL  UA/M w/rflx Culture, Routine   Specimen: Urine   URINE  Result Value Ref Range   Specific Gravity, UA 1.020 1.005 - 1.030   pH, UA 5.5 5.0 - 7.5   Color, UA Yellow Yellow   Appearance Ur Clear Clear   Leukocytes,UA 1+ (A) Negative   Protein,UA Negative Negative/Trace   Glucose, UA Negative Negative   Ketones, UA Negative Negative   RBC, UA Negative Negative   Bilirubin, UA Negative Negative   Urobilinogen, Ur 0.2 0.2 - 1.0 mg/dL   Nitrite, UA Negative Negative   Microscopic Examination See below:    Urinalysis Reflex Comment       Assessment & Plan:   Problem List Items Addressed This Visit      Cardiovascular and Mediastinum   Hypertension   Relevant Medications   amLODipine (NORVASC) 10 MG tablet   losartan-hydrochlorothiazide (HYZAAR) 100-25 MG tablet    Other Visit Diagnoses    Annual physical exam    -  Primary   Relevant Orders   MM Digital Screening   CBC with Differential/Platelet   Comprehensive metabolic panel   Lipid panel   TSH   Urinalysis, Routine w reflex microscopic   Encounter for screening mammogram for malignant neoplasm of breast       Relevant Orders   MM Digital Screening       Follow up plan: Return in about 1 month (around 10/29/2020) for BP Check.   LABORATORY TESTING:  - Pap smear: not applicable  IMMUNIZATIONS:   - Tdap: Tetanus vaccination status reviewed: last tetanus booster within 10 years. - Influenza: Postponed to flu season - Pneumovax: Not applicable - Prevnar: Not applicable - HPV: Not applicable - Zostavax vaccine: Discussed  SCREENING: -Mammogram: Ordered today  - Colonoscopy: Up to date  - Bone  Density: Not applicable  -Hearing Test: Not applicable  -Spirometry: Not applicable   PATIENT  COUNSELING:   Advised to take 1 mg of folate supplement per day if capable of pregnancy.   Sexuality: Discussed sexually transmitted diseases, partner selection, use of condoms, avoidance of unintended pregnancy  and contraceptive alternatives.   Advised to avoid cigarette smoking.  I discussed with the patient that most people either abstain from alcohol or drink within safe limits (<=14/week and <=4 drinks/occasion for males, <=7/weeks and <= 3 drinks/occasion for females) and that the risk for alcohol disorders and other health effects rises proportionally with the number of drinks per week and how often a drinker exceeds daily limits.  Discussed cessation/primary prevention of drug use and availability of treatment for abuse.   Diet: Encouraged to adjust caloric intake to maintain  or achieve ideal body weight, to reduce intake of dietary saturated fat and total fat, to limit sodium intake by avoiding high sodium foods and not adding table salt, and to maintain adequate dietary potassium and calcium preferably from fresh fruits, vegetables, and low-fat dairy products.    stressed the importance of regular exercise  Injury prevention: Discussed safety belts, safety helmets, smoke detector, smoking near bedding or upholstery.   Dental health: Discussed importance of regular tooth brushing, flossing, and dental visits.    NEXT PREVENTATIVE PHYSICAL DUE IN 1 YEAR. Return in about 1 month (around 10/29/2020) for BP Check.

## 2020-09-29 NOTE — Patient Instructions (Signed)
Health Maintenance, Female Adopting a healthy lifestyle and getting preventive care are important in promoting health and wellness. Ask your health care provider about:  The right schedule for you to have regular tests and exams.  Things you can do on your own to prevent diseases and keep yourself healthy. What should I know about diet, weight, and exercise? Eat a healthy diet  Eat a diet that includes plenty of vegetables, fruits, low-fat dairy products, and lean protein.  Do not eat a lot of foods that are high in solid fats, added sugars, or sodium.   Maintain a healthy weight Body mass index (BMI) is used to identify weight problems. It estimates body fat based on height and weight. Your health care provider can help determine your BMI and help you achieve or maintain a healthy weight. Get regular exercise Get regular exercise. This is one of the most important things you can do for your health. Most adults should:  Exercise for at least 150 minutes each week. The exercise should increase your heart rate and make you sweat (moderate-intensity exercise).  Do strengthening exercises at least twice a week. This is in addition to the moderate-intensity exercise.  Spend less time sitting. Even light physical activity can be beneficial. Watch cholesterol and blood lipids Have your blood tested for lipids and cholesterol at 61 years of age, then have this test every 5 years. Have your cholesterol levels checked more often if:  Your lipid or cholesterol levels are high.  You are older than 61 years of age.  You are at high risk for heart disease. What should I know about cancer screening? Depending on your health history and family history, you may need to have cancer screening at various ages. This may include screening for:  Breast cancer.  Cervical cancer.  Colorectal cancer.  Skin cancer.  Lung cancer. What should I know about heart disease, diabetes, and high blood  pressure? Blood pressure and heart disease  High blood pressure causes heart disease and increases the risk of stroke. This is more likely to develop in people who have high blood pressure readings, are of African descent, or are overweight.  Have your blood pressure checked: ? Every 3-5 years if you are 18-39 years of age. ? Every year if you are 40 years old or older. Diabetes Have regular diabetes screenings. This checks your fasting blood sugar level. Have the screening done:  Once every three years after age 40 if you are at a normal weight and have a low risk for diabetes.  More often and at a younger age if you are overweight or have a high risk for diabetes. What should I know about preventing infection? Hepatitis B If you have a higher risk for hepatitis B, you should be screened for this virus. Talk with your health care provider to find out if you are at risk for hepatitis B infection. Hepatitis C Testing is recommended for:  Everyone born from 1945 through 1965.  Anyone with known risk factors for hepatitis C. Sexually transmitted infections (STIs)  Get screened for STIs, including gonorrhea and chlamydia, if: ? You are sexually active and are younger than 61 years of age. ? You are older than 61 years of age and your health care provider tells you that you are at risk for this type of infection. ? Your sexual activity has changed since you were last screened, and you are at increased risk for chlamydia or gonorrhea. Ask your health care provider   if you are at risk.  Ask your health care provider about whether you are at high risk for HIV. Your health care provider may recommend a prescription medicine to help prevent HIV infection. If you choose to take medicine to prevent HIV, you should first get tested for HIV. You should then be tested every 3 months for as long as you are taking the medicine. Pregnancy  If you are about to stop having your period (premenopausal) and  you may become pregnant, seek counseling before you get pregnant.  Take 400 to 800 micrograms (mcg) of folic acid every day if you become pregnant.  Ask for birth control (contraception) if you want to prevent pregnancy. Osteoporosis and menopause Osteoporosis is a disease in which the bones lose minerals and strength with aging. This can result in bone fractures. If you are 65 years old or older, or if you are at risk for osteoporosis and fractures, ask your health care provider if you should:  Be screened for bone loss.  Take a calcium or vitamin D supplement to lower your risk of fractures.  Be given hormone replacement therapy (HRT) to treat symptoms of menopause. Follow these instructions at home: Lifestyle  Do not use any products that contain nicotine or tobacco, such as cigarettes, e-cigarettes, and chewing tobacco. If you need help quitting, ask your health care provider.  Do not use street drugs.  Do not share needles.  Ask your health care provider for help if you need support or information about quitting drugs. Alcohol use  Do not drink alcohol if: ? Your health care provider tells you not to drink. ? You are pregnant, may be pregnant, or are planning to become pregnant.  If you drink alcohol: ? Limit how much you use to 0-1 drink a day. ? Limit intake if you are breastfeeding.  Be aware of how much alcohol is in your drink. In the [place] one drink equals one 12 oz bottle of beer (355 mL), one 5 oz glass of wine (148 mL), or one 1 oz glass of hard liquor (44 mL). General instructions  Schedule regular health, dental, and eye exams.  Stay current with your vaccines.  Tell your health care provider if: ? You often feel depressed. ? You have ever been abused or do not feel safe at home. Summary  Adopting a healthy lifestyle and getting preventive care are important in promoting health and wellness.  Follow your health care provider's instructions about healthy  diet, exercising, and getting tested or screened for diseases.  Follow your health care provider's instructions on monitoring your cholesterol and blood pressure. This information is not intended to replace advice given to you by your health care provider. Make sure you discuss any questions you have with your health care provider. Document Revised: 04/09/2018 Document Reviewed: 04/09/2018 Elsevier Patient Education  2021 [employer]  

## 2020-09-30 LAB — COMPREHENSIVE METABOLIC PANEL
ALT: 12 IU/L (ref 0–32)
AST: 21 IU/L (ref 0–40)
Albumin/Globulin Ratio: 1.8 (ref 1.2–2.2)
Albumin: 4.7 g/dL (ref 3.8–4.9)
Alkaline Phosphatase: 93 IU/L (ref 44–121)
BUN/Creatinine Ratio: 13 (ref 12–28)
BUN: 15 mg/dL (ref 8–27)
Bilirubin Total: <0.2 mg/dL (ref 0.0–1.2)
CO2: 24 mmol/L (ref 20–29)
Calcium: 9.7 mg/dL (ref 8.7–10.3)
Chloride: 102 mmol/L (ref 96–106)
Creatinine, Ser: 1.14 mg/dL — ABNORMAL HIGH (ref 0.57–1.00)
Globulin, Total: 2.6 g/dL (ref 1.5–4.5)
Glucose: 86 mg/dL (ref 65–99)
Potassium: 4.1 mmol/L (ref 3.5–5.2)
Sodium: 142 mmol/L (ref 134–144)
Total Protein: 7.3 g/dL (ref 6.0–8.5)
eGFR: 55 mL/min/{1.73_m2} — ABNORMAL LOW (ref >59)

## 2020-09-30 LAB — CBC WITH DIFFERENTIAL/PLATELET
Basophils Absolute: 0 10*3/uL (ref 0.0–0.2)
Basos: 1 %
EOS (ABSOLUTE): 0.3 10*3/uL (ref 0.0–0.4)
Eos: 6 %
Hematocrit: 38.9 % (ref 34.0–46.6)
Hemoglobin: 12.1 g/dL (ref 11.1–15.9)
Immature Grans (Abs): 0 10*3/uL (ref 0.0–0.1)
Immature Granulocytes: 0 %
Lymphocytes Absolute: 2 10*3/uL (ref 0.7–3.1)
Lymphs: 41 %
MCH: 26.4 pg — ABNORMAL LOW (ref 26.6–33.0)
MCHC: 31.1 g/dL — ABNORMAL LOW (ref 31.5–35.7)
MCV: 85 fL (ref 79–97)
Monocytes Absolute: 0.4 10*3/uL (ref 0.1–0.9)
Monocytes: 8 %
Neutrophils Absolute: 2.2 10*3/uL (ref 1.4–7.0)
Neutrophils: 44 %
Platelets: 187 10*3/uL (ref 150–450)
RBC: 4.59 x10E6/uL (ref 3.77–5.28)
RDW: 13.4 % (ref 11.7–15.4)
WBC: 4.9 10*3/uL (ref 3.4–10.8)

## 2020-09-30 LAB — LIPID PANEL
Chol/HDL Ratio: 3.3 ratio (ref 0.0–4.4)
Cholesterol, Total: 161 mg/dL (ref 100–199)
HDL: 49 mg/dL (ref >39)
LDL Chol Calc (NIH): 76 mg/dL (ref 0–99)
Triglycerides: 217 mg/dL — ABNORMAL HIGH (ref 0–149)
VLDL Cholesterol Cal: 36 mg/dL (ref 5–40)

## 2020-09-30 LAB — TSH: TSH: 2.48 u[IU]/mL (ref 0.450–4.500)

## 2020-09-30 MED ORDER — NITROFURANTOIN MONOHYD MACRO 100 MG PO CAPS: 100.0000 mg | ORAL_CAPSULE | Freq: Two times a day (BID) | ORAL | 0 refills | Status: DC

## 2020-09-30 NOTE — Progress Notes (Signed)
Hi Nichole Palmer. Your lab work shows that your blood counts and thyroid look good. Your kidney function is a little elevated but it is likely because of the UTI. I am going to go ahead and send you an antibiotic to the pharmacy while we wait for the culture results.  After you complete the antibiotic, I would like you to come back for a lab visit and recheck your kidney function. I have sent the antibiotic to the pharmacy.  Please let me know if you have any questions.   Please also call her and let her know there is an antibiotic waiting for her.

## 2020-09-30 NOTE — Addendum Note (Signed)
Addended by: Jon Billings on: 09/30/2020 12:15 PM   Modules accepted: Orders

## 2020-10-01 LAB — URINE CULTURE

## 2020-10-04 NOTE — Progress Notes (Signed)
Hi [name].  There was no bacterial growth on your culture.  You can complete the course of antibiotics.  Let me know if you have any questions.

## 2020-11-01 NOTE — Progress Notes (Signed)
BP (!) 150/82   Pulse 62   Temp 98.6 F (37 C)   Wt 190 lb 4 oz (86.3 kg)   LMP  (LMP Unknown)   SpO2 100%   BMI 30.76 kg/m    Subjective:    Patient ID: Nichole Palmer, female    DOB: Jul 26, 1960, 60 y.o.   MRN: [id number]  HPI: Nichole Palmer is a 60 y.o. female  Chief Complaint  Patient presents with   Hypertension   HYPERTENSION Hypertension status: uncontrolled  Satisfied with current treatment? no Duration of hypertension: years BP monitoring frequency:  not checking BP range:  BP medication side effects:  no Medication compliance: excellent compliance Previous BP meds:amlodipine, HCTZ, and losartan (cozaar) Aspirin: no Recurrent headaches: no Visual changes: no Palpitations: no Dyspnea: no Chest pain: no Lower extremity edema: no Dizzy/lightheaded: no  Patient does complain of right shoulder pain. States she has trouble lifting it.  Would like a cortisone shot.   Relevant past medical, surgical, family and social history reviewed and updated as indicated. Interim medical history since our last visit reviewed. Allergies and medications reviewed and updated.  Review of Systems  Eyes:  Negative for visual disturbance.  Respiratory:  Negative for cough, chest tightness and shortness of breath.   Cardiovascular:  Negative for chest pain, palpitations and leg swelling.  Musculoskeletal:        Right shoulder pain  Neurological:  Negative for dizziness and headaches.   Per HPI unless specifically indicated above     Objective:    BP (!) 150/82   Pulse 62   Temp 98.6 F (37 C)   Wt 190 lb 4 oz (86.3 kg)   LMP  (LMP Unknown)   SpO2 100%   BMI 30.76 kg/m   Wt Readings from Last 3 Encounters:  11/02/20 190 lb 4 oz (86.3 kg)  09/29/20 191 lb 8 oz (86.9 kg)  10/01/19 193 lb (87.5 kg)    Physical Exam Vitals and nursing note reviewed.  Constitutional:      General: She is not in acute distress.    Appearance: Normal appearance. She is normal  weight. She is not ill-appearing, toxic-appearing or diaphoretic.  HENT:     Head: Normocephalic.     Right Ear: External ear normal.     Left Ear: External ear normal.     Nose: Nose normal.     Mouth/Throat:     Mouth: Mucous membranes are moist.     Pharynx: Oropharynx is clear.  Eyes:     General:        Right eye: No discharge.        Left eye: No discharge.     Extraocular Movements: Extraocular movements intact.     Conjunctiva/sclera: Conjunctivae normal.     Pupils: Pupils are equal, round, and reactive to light.  Cardiovascular:     Rate and Rhythm: Normal rate and regular rhythm.     Heart sounds: No murmur heard. Pulmonary:     Effort: Pulmonary effort is normal. No respiratory distress.     Breath sounds: Normal breath sounds. No wheezing or rales.  Musculoskeletal:     Cervical back: Normal range of motion and neck supple.  Skin:    General: Skin is warm and dry.     Capillary Refill: Capillary refill takes less than 2 seconds.  Neurological:     General: No focal deficit present.     Mental Status: She is alert and oriented to  person, place, and time. Mental status is at baseline.  Psychiatric:        Mood and Affect: Mood normal.        Behavior: Behavior normal.        Thought Content: Thought content normal.        Judgment: Judgment normal.    Results for orders placed or performed in visit on 09/29/20  Microscopic Examination   Urine  Result Value Ref Range   WBC, UA None seen 0 - 5 /hpf   RBC 0-2 0 - 2 /hpf   Epithelial Cells (non renal) 0-10 0 - 10 /hpf   Mucus, UA Present (A) Not Estab.   Bacteria, UA Moderate (A) None seen/Few  Urine Culture   Specimen: Urine   UR  Result Value Ref Range   Urine Culture, Routine Final report    Organism ID, Bacteria Comment   CBC with Differential/Platelet  Result Value Ref Range   WBC 4.9 3.4 - 10.8 x10E3/uL   RBC 4.59 3.77 - 5.28 x10E6/uL   Hemoglobin 12.1 11.1 - 15.9 g/dL   Hematocrit 38.9 34.0 -  46.6 %   MCV 85 79 - 97 fL   MCH 26.4 (L) 26.6 - 33.0 pg   MCHC 31.1 (L) 31.5 - 35.7 g/dL   RDW 13.4 11.7 - 15.4 %   Platelets 187 150 - 450 x10E3/uL   Neutrophils 44 Not Estab. %   Lymphs 41 Not Estab. %   Monocytes 8 Not Estab. %   Eos 6 Not Estab. %   Basos 1 Not Estab. %   Neutrophils Absolute 2.2 1.4 - 7.0 x10E3/uL   Lymphocytes Absolute 2.0 0.7 - 3.1 x10E3/uL   Monocytes Absolute 0.4 0.1 - 0.9 x10E3/uL   EOS (ABSOLUTE) 0.3 0.0 - 0.4 x10E3/uL   Basophils Absolute 0.0 0.0 - 0.2 x10E3/uL   Immature Granulocytes 0 Not Estab. %   Immature Grans (Abs) 0.0 0.0 - 0.1 x10E3/uL  Comprehensive metabolic panel  Result Value Ref Range   Glucose 86 65 - 99 mg/dL   BUN 15 8 - 27 mg/dL   Creatinine, Ser 1.14 (H) 0.57 - 1.00 mg/dL   eGFR 55 (L) >59 mL/min/1.73   BUN/Creatinine Ratio 13 12 - 28   Sodium 142 134 - 144 mmol/L   Potassium 4.1 3.5 - 5.2 mmol/L   Chloride 102 96 - 106 mmol/L   CO2 24 20 - 29 mmol/L   Calcium 9.7 8.7 - 10.3 mg/dL   Total Protein 7.3 6.0 - 8.5 g/dL   Albumin 4.7 3.8 - 4.9 g/dL   Globulin, Total 2.6 1.5 - 4.5 g/dL   Albumin/Globulin Ratio 1.8 1.2 - 2.2   Bilirubin Total <0.2 0.0 - 1.2 mg/dL   Alkaline Phosphatase 93 44 - 121 IU/L   AST 21 0 - 40 IU/L   ALT 12 0 - 32 IU/L  Lipid panel  Result Value Ref Range   Cholesterol, Total 161 100 - 199 mg/dL   Triglycerides 217 (H) 0 - 149 mg/dL   HDL 49 >39 mg/dL   VLDL Cholesterol Cal 36 5 - 40 mg/dL   LDL Chol Calc (NIH) 76 0 - 99 mg/dL   Chol/HDL Ratio 3.3 0.0 - 4.4 ratio  TSH  Result Value Ref Range   TSH 2.480 0.450 - 4.500 uIU/mL  Urinalysis, Routine w reflex microscopic  Result Value Ref Range   Specific Gravity, UA 1.025 1.005 - 1.030   pH, UA 5.5 5.0 - 7.5  Color, UA Yellow Yellow   Appearance Ur Cloudy (A) Clear   Leukocytes,UA Negative Negative   Protein,UA Negative Negative/Trace   Glucose, UA Negative Negative   Ketones, UA Negative Negative   RBC, UA Trace (A) Negative   Bilirubin, UA  Negative Negative   Urobilinogen, Ur 1.0 0.2 - 1.0 mg/dL   Nitrite, UA Negative Negative   Microscopic Examination See below:       Assessment & Plan:   Problem List Items Addressed This Visit       Cardiovascular and Mediastinum   Hypertension - Primary    Chronic. Uncontrolled. Will add Hydralazine 79m BID. Recommend checking blood pressures at home daily and bringing log to next visit. Follow up in 1 month for reevaluation. Call sooner if concerns arise.        Relevant Medications   hydrALAZINE (APRESOLINE) 10 MG tablet   Other Visit Diagnoses     Chronic right shoulder pain       Referral placed to Orthopedics for patient to get be evaluated for shoulder injection.   Relevant Orders   Ambulatory referral to Orthopedics        Follow up plan: Return in about 1 month (around 12/03/2020) for BP Check.

## 2020-11-02 ENCOUNTER — Encounter: Payer: Self-pay | Admitting: Nurse Practitioner

## 2020-11-02 ENCOUNTER — Other Ambulatory Visit: Payer: Self-pay

## 2020-11-02 ENCOUNTER — Ambulatory Visit (INDEPENDENT_AMBULATORY_CARE_PROVIDER_SITE_OTHER): Payer: 59 | Admitting: Nurse Practitioner

## 2020-11-02 VITALS — BP 150/82 | HR 62 | Temp 98.6°F | Wt 190.2 lb

## 2020-11-02 DIAGNOSIS — M25511 Pain in right shoulder: Secondary | ICD-10-CM | POA: Diagnosis not present

## 2020-11-02 DIAGNOSIS — I10 Essential (primary) hypertension: Secondary | ICD-10-CM

## 2020-11-02 DIAGNOSIS — G8929 Other chronic pain: Secondary | ICD-10-CM | POA: Diagnosis not present

## 2020-11-02 MED ORDER — HYDRALAZINE HCL 10 MG PO TABS: 10.0000 mg | ORAL_TABLET | Freq: Two times a day (BID) | ORAL | 1 refills | Status: DC

## 2020-11-02 NOTE — Assessment & Plan Note (Signed)
Chronic. Uncontrolled. Will add Hydralazine 10mg  BID. Recommend checking blood pressures at home daily and bringing log to next visit. Follow up in 1 month for reevaluation. Call sooner if concerns arise.

## 2020-11-09 ENCOUNTER — Other Ambulatory Visit: Payer: Self-pay

## 2020-11-09 ENCOUNTER — Ambulatory Visit
Admission: RE | Admit: 2020-11-09 | Discharge: 2020-11-09 | Disposition: A | Discharge status: Home / Self Care | Payer: Self-pay | Source: Ambulatory Visit | Attending: Nurse Practitioner | Admitting: Nurse Practitioner

## 2020-11-09 DIAGNOSIS — Z0000 Encounter for general adult medical examination without abnormal findings: Secondary | ICD-10-CM

## 2020-11-09 DIAGNOSIS — Z1231 Encounter for screening mammogram for malignant neoplasm of breast: Secondary | ICD-10-CM | POA: Insufficient documentation

## 2020-11-13 ENCOUNTER — Other Ambulatory Visit: Payer: Self-pay | Admitting: Nurse Practitioner

## 2020-11-13 DIAGNOSIS — R928 Other abnormal and inconclusive findings on diagnostic imaging of breast: Secondary | ICD-10-CM

## 2020-11-13 NOTE — Progress Notes (Signed)
Please let patient know the radiologist recommends she have a diagnostic mammogram to further evaluate the Left breast.  I have placed this order for her.

## 2020-11-14 ENCOUNTER — Other Ambulatory Visit: Payer: Self-pay | Admitting: Nurse Practitioner

## 2020-11-14 DIAGNOSIS — R928 Other abnormal and inconclusive findings on diagnostic imaging of breast: Secondary | ICD-10-CM

## 2020-11-17 ENCOUNTER — Ambulatory Visit
Admission: RE | Admit: 2020-11-17 | Discharge: 2020-11-17 | Disposition: A | Discharge status: Home / Self Care | Payer: Self-pay | Source: Ambulatory Visit | Attending: Nurse Practitioner | Admitting: Nurse Practitioner

## 2020-11-17 ENCOUNTER — Other Ambulatory Visit: Payer: Self-pay

## 2020-11-17 DIAGNOSIS — R928 Other abnormal and inconclusive findings on diagnostic imaging of breast: Secondary | ICD-10-CM | POA: Insufficient documentation

## 2020-11-18 NOTE — Progress Notes (Signed)
Hi Nichole Palmer, your mammogram showed dense fibrous tissue. No evidence of malignancy. Follow up in 1 year.

## 2020-12-05 ENCOUNTER — Ambulatory Visit (INDEPENDENT_AMBULATORY_CARE_PROVIDER_SITE_OTHER): Payer: 59 | Admitting: Nurse Practitioner

## 2020-12-05 ENCOUNTER — Encounter: Payer: Self-pay | Admitting: Nurse Practitioner

## 2020-12-05 ENCOUNTER — Other Ambulatory Visit: Payer: Self-pay

## 2020-12-05 VITALS — BP 130/80 | HR 71 | Temp 97.5°F | Wt 194.1 lb

## 2020-12-05 DIAGNOSIS — M79671 Pain in right foot: Secondary | ICD-10-CM

## 2020-12-05 DIAGNOSIS — I10 Essential (primary) hypertension: Secondary | ICD-10-CM

## 2020-12-05 MED ORDER — HYDRALAZINE HCL 10 MG PO TABS: 10.0000 mg | ORAL_TABLET | Freq: Two times a day (BID) | ORAL | 1 refills | Status: DC

## 2020-12-05 NOTE — Assessment & Plan Note (Signed)
Chronic. Controlled. Refill of hydralazine sent for patient during visit. Follow up in 6 months for reevaluation.

## 2020-12-05 NOTE — Progress Notes (Signed)
BP 130/80   Pulse 71   Temp (!) 97.5 F (36.4 C)   Wt 194 lb 2 oz (88.1 kg)   LMP  (LMP Unknown)   SpO2 97%   BMI 31.38 kg/m    Subjective:    Patient ID: Nichole Palmer, female    DOB: Dec 11, 1960, 60 y.o.   MRN: [id number]  HPI: Nichole Palmer is a 60 y.o. female  Chief Complaint  Patient presents with   Hypertension   HYPERTENSION Hypertension status: controlled  Satisfied with current treatment? no Duration of hypertension: years BP monitoring frequency:  not checking BP range:  BP medication side effects:  no Medication compliance: excellent compliance Previous BP meds: hydralazine, amlodipine, HCTZ, and losartan (cozaar) Aspirin: no Recurrent headaches: no Visual changes: no Palpitations: no Dyspnea: no Chest pain: no Lower extremity edema: no Dizzy/lightheaded: no  Patient states she has right foot pain.  Hurts on the top of her foot.  Hurts with some shoes and when there is pressure on it it hurts more.  Relevant past medical, surgical, family and social history reviewed and updated as indicated. Interim medical history since our last visit reviewed. Allergies and medications reviewed and updated.  Review of Systems  Eyes:  Negative for visual disturbance.  Respiratory:  Negative for cough, chest tightness and shortness of breath.   Cardiovascular:  Negative for chest pain, palpitations and leg swelling.  Musculoskeletal:        Right foot pain  Neurological:  Negative for dizziness and headaches.   Per HPI unless specifically indicated above     Objective:    BP 130/80   Pulse 71   Temp (!) 97.5 F (36.4 C)   Wt 194 lb 2 oz (88.1 kg)   LMP  (LMP Unknown)   SpO2 97%   BMI 31.38 kg/m   Wt Readings from Last 3 Encounters:  12/05/20 194 lb 2 oz (88.1 kg)  11/02/20 190 lb 4 oz (86.3 kg)  09/29/20 191 lb 8 oz (86.9 kg)    Physical Exam Vitals and nursing note reviewed.  Constitutional:      General: She is not in acute distress.     Appearance: Normal appearance. She is normal weight. She is not ill-appearing, toxic-appearing or diaphoretic.  HENT:     Head: Normocephalic.     Right Ear: External ear normal.     Left Ear: External ear normal.     Nose: Nose normal.     Mouth/Throat:     Mouth: Mucous membranes are moist.     Pharynx: Oropharynx is clear.  Eyes:     General:        Right eye: No discharge.        Left eye: No discharge.     Extraocular Movements: Extraocular movements intact.     Conjunctiva/sclera: Conjunctivae normal.     Pupils: Pupils are equal, round, and reactive to light.  Cardiovascular:     Rate and Rhythm: Normal rate and regular rhythm.     Heart sounds: No murmur heard. Pulmonary:     Effort: Pulmonary effort is normal. No respiratory distress.     Breath sounds: Normal breath sounds. No wheezing or rales.  Musculoskeletal:     Cervical back: Normal range of motion and neck supple.       Feet:  Skin:    General: Skin is warm and dry.     Capillary Refill: Capillary refill takes less than 2 seconds.  Neurological:  General: No focal deficit present.     Mental Status: She is alert and oriented to person, place, and time. Mental status is at baseline.  Psychiatric:        Mood and Affect: Mood normal.        Behavior: Behavior normal.        Thought Content: Thought content normal.        Judgment: Judgment normal.    Results for orders placed or performed in visit on 09/29/20  Microscopic Examination   Urine  Result Value Ref Range   WBC, UA None seen 0 - 5 /hpf   RBC 0-2 0 - 2 /hpf   Epithelial Cells (non renal) 0-10 0 - 10 /hpf   Mucus, UA Present (A) Not Estab.   Bacteria, UA Moderate (A) None seen/Few  Urine Culture   Specimen: Urine   UR  Result Value Ref Range   Urine Culture, Routine Final report    Organism ID, Bacteria Comment   CBC with Differential/Platelet  Result Value Ref Range   WBC 4.9 3.4 - 10.8 x10E3/uL   RBC 4.59 3.77 - 5.28 x10E6/uL    Hemoglobin 12.1 11.1 - 15.9 g/dL   Hematocrit 38.9 34.0 - 46.6 %   MCV 85 79 - 97 fL   MCH 26.4 (L) 26.6 - 33.0 pg   MCHC 31.1 (L) 31.5 - 35.7 g/dL   RDW 13.4 11.7 - 15.4 %   Platelets 187 150 - 450 x10E3/uL   Neutrophils 44 Not Estab. %   Lymphs 41 Not Estab. %   Monocytes 8 Not Estab. %   Eos 6 Not Estab. %   Basos 1 Not Estab. %   Neutrophils Absolute 2.2 1.4 - 7.0 x10E3/uL   Lymphocytes Absolute 2.0 0.7 - 3.1 x10E3/uL   Monocytes Absolute 0.4 0.1 - 0.9 x10E3/uL   EOS (ABSOLUTE) 0.3 0.0 - 0.4 x10E3/uL   Basophils Absolute 0.0 0.0 - 0.2 x10E3/uL   Immature Granulocytes 0 Not Estab. %   Immature Grans (Abs) 0.0 0.0 - 0.1 x10E3/uL  Comprehensive metabolic panel  Result Value Ref Range   Glucose 86 65 - 99 mg/dL   BUN 15 8 - 27 mg/dL   Creatinine, Ser 1.14 (H) 0.57 - 1.00 mg/dL   eGFR 55 (L) >59 mL/min/1.73   BUN/Creatinine Ratio 13 12 - 28   Sodium 142 134 - 144 mmol/L   Potassium 4.1 3.5 - 5.2 mmol/L   Chloride 102 96 - 106 mmol/L   CO2 24 20 - 29 mmol/L   Calcium 9.7 8.7 - 10.3 mg/dL   Total Protein 7.3 6.0 - 8.5 g/dL   Albumin 4.7 3.8 - 4.9 g/dL   Globulin, Total 2.6 1.5 - 4.5 g/dL   Albumin/Globulin Ratio 1.8 1.2 - 2.2   Bilirubin Total <0.2 0.0 - 1.2 mg/dL   Alkaline Phosphatase 93 44 - 121 IU/L   AST 21 0 - 40 IU/L   ALT 12 0 - 32 IU/L  Lipid panel  Result Value Ref Range   Cholesterol, Total 161 100 - 199 mg/dL   Triglycerides 217 (H) 0 - 149 mg/dL   HDL 49 >39 mg/dL   VLDL Cholesterol Cal 36 5 - 40 mg/dL   LDL Chol Calc (NIH) 76 0 - 99 mg/dL   Chol/HDL Ratio 3.3 0.0 - 4.4 ratio  TSH  Result Value Ref Range   TSH 2.480 0.450 - 4.500 uIU/mL  Urinalysis, Routine w reflex microscopic  Result Value Ref Range  Specific Gravity, UA 1.025 1.005 - 1.030   pH, UA 5.5 5.0 - 7.5   Color, UA Yellow Yellow   Appearance Ur Cloudy (A) Clear   Leukocytes,UA Negative Negative   Protein,UA Negative Negative/Trace   Glucose, UA Negative Negative   Ketones, UA Negative  Negative   RBC, UA Trace (A) Negative   Bilirubin, UA Negative Negative   Urobilinogen, Ur 1.0 0.2 - 1.0 mg/dL   Nitrite, UA Negative Negative   Microscopic Examination See below:       Assessment & Plan:   Problem List Items Addressed This Visit       Cardiovascular and Mediastinum   Hypertension    Chronic. Controlled. Refill of hydralazine sent for patient during visit. Follow up in 6 months for reevaluation.        Relevant Medications   hydrALAZINE (APRESOLINE) 10 MG tablet   Other Visit Diagnoses     Right foot pain    -  Primary   Obtain foot xray.  Will make recommendations based on imaging results.    Relevant Orders   DG Foot Complete Right        Follow up plan: Return in about 6 months (around 06/07/2021) for HTN, HLD, DM2 FU.

## 2020-12-12 ENCOUNTER — Ambulatory Visit
Admission: RE | Admit: 2020-12-12 | Discharge: 2020-12-12 | Disposition: A | Discharge status: Home / Self Care | Payer: 59 | Source: Ambulatory Visit | Attending: Nurse Practitioner | Admitting: Nurse Practitioner

## 2020-12-12 ENCOUNTER — Ambulatory Visit
Admission: RE | Admit: 2020-12-12 | Discharge: 2020-12-12 | Disposition: A | Discharge status: Home / Self Care | Payer: 59 | Source: Home / Self Care | Attending: Nurse Practitioner | Admitting: Nurse Practitioner

## 2020-12-12 ENCOUNTER — Other Ambulatory Visit: Payer: Self-pay

## 2020-12-12 DIAGNOSIS — M79671 Pain in right foot: Secondary | ICD-10-CM | POA: Insufficient documentation

## 2021-06-07 ENCOUNTER — Other Ambulatory Visit: Payer: Self-pay

## 2021-06-07 ENCOUNTER — Ambulatory Visit (INDEPENDENT_AMBULATORY_CARE_PROVIDER_SITE_OTHER): Payer: 59 | Admitting: Nurse Practitioner

## 2021-06-07 ENCOUNTER — Telehealth: Payer: Self-pay

## 2021-06-07 ENCOUNTER — Encounter: Payer: Self-pay | Admitting: Nurse Practitioner

## 2021-06-07 VITALS — BP 128/76 | HR 69 | Temp 98.6°F | Wt 191.8 lb

## 2021-06-07 DIAGNOSIS — I10 Essential (primary) hypertension: Secondary | ICD-10-CM | POA: Diagnosis not present

## 2021-06-07 DIAGNOSIS — E781 Pure hyperglyceridemia: Secondary | ICD-10-CM

## 2021-06-07 DIAGNOSIS — Z1211 Encounter for screening for malignant neoplasm of colon: Secondary | ICD-10-CM | POA: Diagnosis not present

## 2021-06-07 MED ORDER — LOSARTAN POTASSIUM-HCTZ 100-25 MG PO TABS: 1.0000 | ORAL_TABLET | Freq: Every day | ORAL | 1 refills | Status: DC

## 2021-06-07 MED ORDER — AMLODIPINE BESYLATE 10 MG PO TABS: 10.0000 mg | ORAL_TABLET | Freq: Every day | ORAL | 1 refills | Status: DC

## 2021-06-07 MED ORDER — HYDRALAZINE HCL 10 MG PO TABS: 10.0000 mg | ORAL_TABLET | Freq: Two times a day (BID) | ORAL | 1 refills | Status: DC

## 2021-06-07 NOTE — Telephone Encounter (Signed)
CALLED PATIENT NO ANSWER LEFT VOICEMAIL FOR A CALL BACK ? ?

## 2021-06-07 NOTE — Progress Notes (Signed)
BP 128/76    Pulse 69    Temp 98.6 F (37 C) (Oral)    Wt 191 lb 12.8 oz (87 kg)    LMP  (LMP Unknown)    SpO2 98%    BMI 31.01 kg/m    Subjective:    Patient ID: Nichole Palmer, female    DOB: 10-23-1960, 61 y.o.   MRN: [id number]  HPI: Nichole Palmer is a 61 y.o. female  Chief Complaint  Patient presents with   Hypertension   HYPERTENSION Hypertension status: controlled  Satisfied with current treatment? no Duration of hypertension: years BP monitoring frequency:  not checking BP range:  BP medication side effects:  no Medication compliance: excellent compliance Previous BP meds:amlodipine, HCTZ, and losartan (cozaar) hydralazine  Aspirin: no Recurrent headaches: no Visual changes: no Palpitations: no Dyspnea: no Chest pain: no Lower extremity edema: no Dizzy/lightheaded: no  Relevant past medical, surgical, family and social history reviewed and updated as indicated. Interim medical history since our last visit reviewed. Allergies and medications reviewed and updated.  Review of Systems  Eyes:  Negative for visual disturbance.  Respiratory:  Negative for cough, chest tightness and shortness of breath.   Cardiovascular:  Negative for chest pain, palpitations and leg swelling.  Neurological:  Negative for dizziness and headaches.   Per HPI unless specifically indicated above     Objective:    BP 128/76    Pulse 69    Temp 98.6 F (37 C) (Oral)    Wt 191 lb 12.8 oz (87 kg)    LMP  (LMP Unknown)    SpO2 98%    BMI 31.01 kg/m   Wt Readings from Last 3 Encounters:  06/07/21 191 lb 12.8 oz (87 kg)  12/05/20 194 lb 2 oz (88.1 kg)  11/02/20 190 lb 4 oz (86.3 kg)    Physical Exam Vitals and nursing note reviewed.  Constitutional:      General: She is not in acute distress.    Appearance: Normal appearance. She is normal weight. She is not ill-appearing, toxic-appearing or diaphoretic.  HENT:     Head: Normocephalic.     Right Ear: External ear normal.      Left Ear: External ear normal.     Nose: Nose normal.     Mouth/Throat:     Mouth: Mucous membranes are moist.     Pharynx: Oropharynx is clear.  Eyes:     General:        Right eye: No discharge.        Left eye: No discharge.     Extraocular Movements: Extraocular movements intact.     Conjunctiva/sclera: Conjunctivae normal.     Pupils: Pupils are equal, round, and reactive to light.  Cardiovascular:     Rate and Rhythm: Normal rate and regular rhythm.     Heart sounds: No murmur heard. Pulmonary:     Effort: Pulmonary effort is normal. No respiratory distress.     Breath sounds: Normal breath sounds. No wheezing or rales.  Musculoskeletal:     Cervical back: Normal range of motion and neck supple.  Skin:    General: Skin is warm and dry.     Capillary Refill: Capillary refill takes less than 2 seconds.  Neurological:     General: No focal deficit present.     Mental Status: She is alert and oriented to person, place, and time. Mental status is at baseline.  Psychiatric:  Mood and Affect: Mood normal.        Behavior: Behavior normal.        Thought Content: Thought content normal.        Judgment: Judgment normal.    Results for orders placed or performed in visit on 09/29/20  Microscopic Examination   Urine  Result Value Ref Range   WBC, UA None seen 0 - 5 /hpf   RBC 0-2 0 - 2 /hpf   Epithelial Cells (non renal) 0-10 0 - 10 /hpf   Mucus, UA Present (A) Not Estab.   Bacteria, UA Moderate (A) None seen/Few  Urine Culture   Specimen: Urine   UR  Result Value Ref Range   Urine Culture, Routine Final report    Organism ID, Bacteria Comment   CBC with Differential/Platelet  Result Value Ref Range   WBC 4.9 3.4 - 10.8 x10E3/uL   RBC 4.59 3.77 - 5.28 x10E6/uL   Hemoglobin 12.1 11.1 - 15.9 g/dL   Hematocrit 38.9 34.0 - 46.6 %   MCV 85 79 - 97 fL   MCH 26.4 (L) 26.6 - 33.0 pg   MCHC 31.1 (L) 31.5 - 35.7 g/dL   RDW 13.4 11.7 - 15.4 %   Platelets 187 150 -  450 x10E3/uL   Neutrophils 44 Not Estab. %   Lymphs 41 Not Estab. %   Monocytes 8 Not Estab. %   Eos 6 Not Estab. %   Basos 1 Not Estab. %   Neutrophils Absolute 2.2 1.4 - 7.0 x10E3/uL   Lymphocytes Absolute 2.0 0.7 - 3.1 x10E3/uL   Monocytes Absolute 0.4 0.1 - 0.9 x10E3/uL   EOS (ABSOLUTE) 0.3 0.0 - 0.4 x10E3/uL   Basophils Absolute 0.0 0.0 - 0.2 x10E3/uL   Immature Granulocytes 0 Not Estab. %   Immature Grans (Abs) 0.0 0.0 - 0.1 x10E3/uL  Comprehensive metabolic panel  Result Value Ref Range   Glucose 86 65 - 99 mg/dL   BUN 15 8 - 27 mg/dL   Creatinine, Ser 1.14 (H) 0.57 - 1.00 mg/dL   eGFR 55 (L) >59 mL/min/1.73   BUN/Creatinine Ratio 13 12 - 28   Sodium 142 134 - 144 mmol/L   Potassium 4.1 3.5 - 5.2 mmol/L   Chloride 102 96 - 106 mmol/L   CO2 24 20 - 29 mmol/L   Calcium 9.7 8.7 - 10.3 mg/dL   Total Protein 7.3 6.0 - 8.5 g/dL   Albumin 4.7 3.8 - 4.9 g/dL   Globulin, Total 2.6 1.5 - 4.5 g/dL   Albumin/Globulin Ratio 1.8 1.2 - 2.2   Bilirubin Total <0.2 0.0 - 1.2 mg/dL   Alkaline Phosphatase 93 44 - 121 IU/L   AST 21 0 - 40 IU/L   ALT 12 0 - 32 IU/L  Lipid panel  Result Value Ref Range   Cholesterol, Total 161 100 - 199 mg/dL   Triglycerides 217 (H) 0 - 149 mg/dL   HDL 49 >39 mg/dL   VLDL Cholesterol Cal 36 5 - 40 mg/dL   LDL Chol Calc (NIH) 76 0 - 99 mg/dL   Chol/HDL Ratio 3.3 0.0 - 4.4 ratio  TSH  Result Value Ref Range   TSH 2.480 0.450 - 4.500 uIU/mL  Urinalysis, Routine w reflex microscopic  Result Value Ref Range   Specific Gravity, UA 1.025 1.005 - 1.030   pH, UA 5.5 5.0 - 7.5   Color, UA Yellow Yellow   Appearance Ur Cloudy (A) Clear   Leukocytes,UA Negative Negative  Protein,UA Negative Negative/Trace   Glucose, UA Negative Negative   Ketones, UA Negative Negative   RBC, UA Trace (A) Negative   Bilirubin, UA Negative Negative   Urobilinogen, Ur 1.0 0.2 - 1.0 mg/dL   Nitrite, UA Negative Negative   Microscopic Examination See below:        Assessment & Plan:   Problem List Items Addressed This Visit       Cardiovascular and Mediastinum   Hypertension - Primary    Chronic.  Controlled.  Continue with current medication regimen on Amlodipine, Losartan, hydralazine, and HCTZ.  Refills sent today.  Labs ordered today.  Return to clinic in 6 months for reevaluation.  Call sooner if concerns arise.        Relevant Medications   amLODipine (NORVASC) 10 MG tablet   hydrALAZINE (APRESOLINE) 10 MG tablet   losartan-hydrochlorothiazide (HYZAAR) 100-25 MG tablet   Other Relevant Orders   Comp Met (CMET)     Other   Hypertriglyceridemia    Labs ordered today. Will make recommendations based on lab results.        Relevant Medications   amLODipine (NORVASC) 10 MG tablet   hydrALAZINE (APRESOLINE) 10 MG tablet   losartan-hydrochlorothiazide (HYZAAR) 100-25 MG tablet   Other Relevant Orders   Lipid Profile   Other Visit Diagnoses     Screening for colon cancer       Relevant Orders   Ambulatory referral to Gastroenterology        Follow up plan: Return in about 6 months (around 12/05/2021) for Physical and Fasting labs.

## 2021-06-07 NOTE — Assessment & Plan Note (Signed)
Chronic.  Controlled.  Continue with current medication regimen on Amlodipine, Losartan, hydralazine, and HCTZ.  Refills sent today.  Labs ordered today.  Return to clinic in 6 months for reevaluation.  Call sooner if concerns arise.

## 2021-06-07 NOTE — Assessment & Plan Note (Signed)
Labs ordered today.  Will make recommendations based on lab results. ?

## 2021-06-08 ENCOUNTER — Telehealth: Payer: Self-pay

## 2021-06-08 LAB — LIPID PANEL
Chol/HDL Ratio: 3 ratio (ref 0.0–4.4)
Cholesterol, Total: 158 mg/dL (ref 100–199)
HDL: 53 mg/dL (ref >39)
LDL Chol Calc (NIH): 80 mg/dL (ref 0–99)
Triglycerides: 145 mg/dL (ref 0–149)
VLDL Cholesterol Cal: 25 mg/dL (ref 5–40)

## 2021-06-08 LAB — COMPREHENSIVE METABOLIC PANEL
ALT: 11 IU/L (ref 0–32)
AST: 13 IU/L (ref 0–40)
Albumin/Globulin Ratio: 1.8 (ref 1.2–2.2)
Albumin: 4.6 g/dL (ref 3.8–4.8)
Alkaline Phosphatase: 99 IU/L (ref 44–121)
BUN/Creatinine Ratio: 17 (ref 12–28)
BUN: 21 mg/dL (ref 8–27)
Bilirubin Total: <0.2 mg/dL (ref 0.0–1.2)
CO2: 25 mmol/L (ref 20–29)
Calcium: 9.7 mg/dL (ref 8.7–10.3)
Chloride: 104 mmol/L (ref 96–106)
Creatinine, Ser: 1.23 mg/dL — ABNORMAL HIGH (ref 0.57–1.00)
Globulin, Total: 2.5 g/dL (ref 1.5–4.5)
Glucose: 112 mg/dL — ABNORMAL HIGH (ref 70–99)
Potassium: 3.9 mmol/L (ref 3.5–5.2)
Sodium: 142 mmol/L (ref 134–144)
Total Protein: 7.1 g/dL (ref 6.0–8.5)
eGFR: 50 mL/min/{1.73_m2} — ABNORMAL LOW (ref >59)

## 2021-06-08 NOTE — Progress Notes (Signed)
Hi Nichole Palmer it was good to see you yesterday.  Your lab results show that your kidney function is still declined but stable.  Make sure you are drinking at least 60 ounces of water daily and avoiding NSAIDS such as ibuprofen, motrin and aleve. Your cholesterol looks great.  I will see you at our next visit.

## 2021-06-08 NOTE — Telephone Encounter (Signed)
CALLED PATIENT NO ANSWER LEFT VOICEMAIL FOR A CALL BACK °Letter sent °

## 2021-08-15 ENCOUNTER — Ambulatory Visit (INDEPENDENT_AMBULATORY_CARE_PROVIDER_SITE_OTHER): Payer: 59 | Admitting: Family Medicine

## 2021-08-15 ENCOUNTER — Encounter: Payer: Self-pay | Admitting: Family Medicine

## 2021-08-15 VITALS — BP 150/89 | HR 80 | Temp 98.0°F

## 2021-08-15 DIAGNOSIS — R300 Dysuria: Secondary | ICD-10-CM

## 2021-08-15 DIAGNOSIS — R3121 Asymptomatic microscopic hematuria: Secondary | ICD-10-CM | POA: Diagnosis not present

## 2021-08-15 DIAGNOSIS — N760 Acute vaginitis: Secondary | ICD-10-CM | POA: Diagnosis not present

## 2021-08-15 DIAGNOSIS — B9689 Other specified bacterial agents as the cause of diseases classified elsewhere: Secondary | ICD-10-CM

## 2021-08-15 LAB — URINALYSIS, ROUTINE W REFLEX MICROSCOPIC
Bilirubin, UA: NEGATIVE
Glucose, UA: NEGATIVE
Leukocytes,UA: NEGATIVE
Nitrite, UA: NEGATIVE
Specific Gravity, UA: >1.03 — ABNORMAL HIGH (ref 1.005–1.030)
Urobilinogen, Ur: 0.2 mg/dL (ref 0.2–1.0)
pH, UA: 5.5 (ref 5.0–7.5)

## 2021-08-15 LAB — WET PREP FOR TRICH, YEAST, CLUE
Clue Cell Exam: POSITIVE — AB
Trichomonas Exam: NEGATIVE
Yeast Exam: NEGATIVE

## 2021-08-15 LAB — MICROSCOPIC EXAMINATION: WBC, UA: NONE SEEN /hpf (ref 0–5)

## 2021-08-15 MED ORDER — FLUCONAZOLE 150 MG PO TABS: 150.0000 mg | ORAL_TABLET | Freq: Every day | ORAL | 0 refills | Status: DC

## 2021-08-15 MED ORDER — METRONIDAZOLE 500 MG PO TABS: 500.0000 mg | ORAL_TABLET | Freq: Two times a day (BID) | ORAL | 0 refills | Status: DC

## 2021-08-15 NOTE — Progress Notes (Signed)
? ?BP (!) 150/89   Pulse 80   Temp 98 ?F (36.7 ?C)   LMP  (LMP Unknown)   SpO2 100%   ? ?Subjective:  ? ? Patient ID: Nichole Palmer, female    DOB: 04/12/1961, 60 y.o.   MRN: [id number] ? ?HPI: ?Nichole Palmer is a 60 y.o. female ? ?Chief Complaint  ?Patient presents with  ? Back Pain  ?  Patient states she is having lower back pain, and some discharge.   ? ?URINARY SYMPTOMS ?Duration: about a week ?Dysuria: no ?Urinary frequency: yes ?Urgency: yes ?Small volume voids: no ?Symptom severity: mild ?Urinary incontinence: no ?Foul odor: no ?Hematuria: no ?Abdominal pain: no ?Back pain: yes ?Suprapubic pain/pressure: no ?Flank pain: no ?Fever:  no ?Vomiting: no ?Relief with cranberry juice: no ?Relief with pyridium: no ?Status: worse ?Previous urinary tract infection: yes ?Recurrent urinary tract infection: no ?History of sexually transmitted disease: no ?Vaginal discharge: no ?Treatments attempted: increasing fluids  ? ?Relevant past medical, surgical, family and social history reviewed and updated as indicated. Interim medical history since our last visit reviewed. ?Allergies and medications reviewed and updated. ? ?Review of Systems  ?Constitutional: Negative.   ?Respiratory: Negative.    ?Cardiovascular: Negative.   ?Gastrointestinal: Negative.   ?Genitourinary:  Positive for frequency, urgency and vaginal discharge. Negative for decreased urine volume, difficulty urinating, dyspareunia, dysuria, enuresis, flank pain, genital sores, hematuria, menstrual problem, pelvic pain, vaginal bleeding and vaginal pain.  ?Musculoskeletal:  Positive for back pain. Negative for arthralgias, gait problem, joint swelling, myalgias, neck pain and neck stiffness.  ?Psychiatric/Behavioral: Negative.    ? ?Per HPI unless specifically indicated above ? ?   ?Objective:  ?  ?BP (!) 150/89   Pulse 80   Temp 98 ?F (36.7 ?C)   LMP  (LMP Unknown)   SpO2 100%   ?Wt Readings from Last 3 Encounters:  ?06/07/21 191 lb 12.8 oz (87  kg)  ?12/05/20 194 lb 2 oz (88.1 kg)  ?11/02/20 190 lb 4 oz (86.3 kg)  ?  ?Physical Exam ?Vitals and nursing note reviewed.  ?Constitutional:   ?   General: She is not in acute distress. ?   Appearance: Normal appearance. She is not ill-appearing, toxic-appearing or diaphoretic.  ?HENT:  ?   Head: Normocephalic and atraumatic.  ?   Right Ear: External ear normal.  ?   Left Ear: External ear normal.  ?   Nose: Nose normal.  ?   Mouth/Throat:  ?   Mouth: Mucous membranes are moist.  ?   Pharynx: Oropharynx is clear.  ?Eyes:  ?   General: No scleral icterus.    ?   Right eye: No discharge.     ?   Left eye: No discharge.  ?   Extraocular Movements: Extraocular movements intact.  ?   Conjunctiva/sclera: Conjunctivae normal.  ?   Pupils: Pupils are equal, round, and reactive to light.  ?Cardiovascular:  ?   Rate and Rhythm: Normal rate and regular rhythm.  ?   Pulses: Normal pulses.  ?   Heart sounds: Normal heart sounds. No murmur heard. ?  No friction rub. No gallop.  ?Pulmonary:  ?   Effort: Pulmonary effort is normal. No respiratory distress.  ?   Breath sounds: Normal breath sounds. No stridor. No wheezing, rhonchi or rales.  ?Chest:  ?   Chest wall: No tenderness.  ?Musculoskeletal:     ?   General: Normal range of motion.  ?     Cervical back: Normal range of motion and neck supple.  ?Skin: ?   General: Skin is warm and dry.  ?   Capillary Refill: Capillary refill takes less than 2 seconds.  ?   Coloration: Skin is not jaundiced or pale.  ?   Findings: No bruising, erythema, lesion or rash.  ?Neurological:  ?   General: No focal deficit present.  ?   Mental Status: She is alert and oriented to person, place, and time. Mental status is at baseline.  ?Psychiatric:     ?   Mood and Affect: Mood normal.     ?   Behavior: Behavior normal.     ?   Thought Content: Thought content normal.     ?   Judgment: Judgment normal.  ? ? ?Results for orders placed or performed in visit on 06/07/21  ?Comp Met (CMET)  ?Result Value  Ref Range  ? Glucose 112 (H) 70 - 99 mg/dL  ? BUN 21 8 - 27 mg/dL  ? Creatinine, Ser 1.23 (H) 0.57 - 1.00 mg/dL  ? eGFR 50 (L) >59 mL/min/1.73  ? BUN/Creatinine Ratio 17 12 - 28  ? Sodium 142 134 - 144 mmol/L  ? Potassium 3.9 3.5 - 5.2 mmol/L  ? Chloride 104 96 - 106 mmol/L  ? CO2 25 20 - 29 mmol/L  ? Calcium 9.7 8.7 - 10.3 mg/dL  ? Total Protein 7.1 6.0 - 8.5 g/dL  ? Albumin 4.6 3.8 - 4.8 g/dL  ? Globulin, Total 2.5 1.5 - 4.5 g/dL  ? Albumin/Globulin Ratio 1.8 1.2 - 2.2  ? Bilirubin Total <0.2 0.0 - 1.2 mg/dL  ? Alkaline Phosphatase 99 44 - 121 IU/L  ? AST 13 0 - 40 IU/L  ? ALT 11 0 - 32 IU/L  ?Lipid Profile  ?Result Value Ref Range  ? Cholesterol, Total 158 100 - 199 mg/dL  ? Triglycerides 145 0 - 149 mg/dL  ? HDL 53 >39 mg/dL  ? VLDL Cholesterol Cal 25 5 - 40 mg/dL  ? LDL Chol Calc (NIH) 80 0 - 99 mg/dL  ? Chol/HDL Ratio 3.0 0.0 - 4.4 ratio  ? ?   ?Assessment & Plan:  ? ?Problem List Items Addressed This Visit   ?None ?Visit Diagnoses   ? ? BV (bacterial vaginosis)    -  Primary  ? Will treat with flagyl. Call with any concerns or if not getting better.   ? Relevant Medications  ? metroNIDAZOLE (FLAGYL) 500 MG tablet  ? fluconazole (DIFLUCAN) 150 MG tablet  ? Dysuria      ? Trace blood, likely due to BV. Will treat.   ? Relevant Orders  ? Urinalysis, Routine w reflex microscopic  ? WET PREP FOR TRICH, YEAST, CLUE  ? Asymptomatic microscopic hematuria      ? Will send for culture.   ? Relevant Orders  ? Urine Culture  ? ?  ?  ? ?Follow up plan: ?Return if symptoms worsen or fail to improve. ? ? ? ? ? ?

## 2021-08-17 LAB — URINE CULTURE

## 2021-12-04 NOTE — Progress Notes (Unsigned)
LMP  (LMP Unknown)    Subjective:    Patient ID: Nichole Palmer, female    DOB: 02/27/1961, 61 y.o.   MRN: [id number]  HPI: Nichole Palmer is a 61 y.o. female presenting on 12/05/2021 for comprehensive medical examination. Current medical complaints include:{Blank single:19197::"none","***"}  She currently lives with: Menopausal Symptoms: {Blank single:19197::"yes","no"}  HYPERTENSION / [place] Satisfied with current treatment? {Blank single:19197::"yes","no"} Duration of hypertension: {Blank single:19197::"chronic","months","years"} BP monitoring frequency: {Blank single:19197::"not checking","rarely","daily","weekly","monthly","a few times a day","a few times a week","a few times a month"} BP range:  BP medication side effects: {Blank single:19197::"yes","no"} Past BP meds: {Blank UYQIHKVQ:25956::"LOVF","IEPPIRJJOA","CZYSAYTKZS/WFUXNATFTD","DUKGURKY","HCWCBJSEGB","TDVVOHYWVP/XTGG","YIRSWNIOEV (bystolic)","carvedilol","chlorthalidone","clonidine","diltiazem","exforge HCT","HCTZ","irbesartan (avapro)","labetalol","lisinopril","lisinopril-HCTZ","losartan (cozaar)","methyldopa","nifedipine","olmesartan (benicar)","olmesartan-HCTZ","quinapril","ramipril","spironalactone","tekturna","valsartan","valsartan-HCTZ","verapamil"} Duration of hyperlipidemia: {Blank single:19197::"chronic","months","years"} Cholesterol medication side effects: {Blank single:19197::"yes","no"} Cholesterol supplements: {Blank multiple:19196::"none","fish oil","niacin","red yeast rice"} Past cholesterol medications: {Blank multiple:19196::"none","atorvastain (lipitor)","lovastatin (mevacor)","pravastatin (pravachol)","rosuvastatin (crestor)","simvastatin (zocor)","vytorin","fenofibrate (tricor)","gemfibrozil","ezetimide (zetia)","niaspan","lovaza"} Medication compliance: {Blank single:19197::"excellent compliance","good compliance","fair compliance","poor compliance"} Aspirin: {Blank  single:19197::"yes","no"} Recent stressors: {Blank single:19197::"yes","no"} Recurrent headaches: {Blank single:19197::"yes","no"} Visual changes: {Blank single:19197::"yes","no"} Palpitations: {Blank single:19197::"yes","no"} Dyspnea: {Blank single:19197::"yes","no"} Chest pain: {Blank single:19197::"yes","no"} Lower extremity edema: {Blank single:19197::"yes","no"} Dizzy/lightheaded: {Blank single:19197::"yes","no"}  Depression Screen done today and results listed below:     08/15/2021    2:17 PM 06/07/2021    3:55 PM 09/29/2020    3:39 PM 07/09/2019   11:50 AM 03/13/2017    3:58 PM  Depression screen PHQ 2/9  Decreased Interest 0 0 0 0 0  Down, Depressed, Hopeless 0 0 0 0 0  PHQ - 2 Score 0 0 0 0 0  Altered sleeping 0 1  1 0  Tired, decreased energy 0 0  0 0  Change in appetite 0 1  0 0  Feeling bad or failure about yourself  0 0  0 0  Trouble concentrating 0 0  0 0  Moving slowly or fidgety/restless 0 0  0 0  Suicidal thoughts 0 0  0 0  PHQ-9 Score 0 2  1 0  Difficult doing work/chores  Not difficult at all       The patient {has/does not OJJK:09381} a history of falls. I {did/did not:19850} complete a risk assessment for falls. A plan of care for falls {was/was not:19852} documented.   Past Medical History:  Past Medical History:  Diagnosis Date   Hypertension     Surgical History:  Past Surgical History:  Procedure Laterality Date   TOTAL ABDOMINAL HYSTERECTOMY     partial    Medications:  Current Outpatient Medications on File Prior to Visit  Medication Sig   amLODipine (NORVASC) 10 MG tablet Take 1 tablet (10 mg total) by mouth daily.   clobetasol ointment (TEMOVATE) 8.29 % Apply 1 application topically 2 (two) times daily.   diphenhydrAMINE HCl (BENADRYL ALLERGY PO) Take by mouth daily.   fluconazole (DIFLUCAN) 150 MG tablet Take 1 tablet (150 mg total) by mouth daily. OK to take 1 while on abx, save 2nd for when you finish   fluticasone (FLONASE) 50 MCG/ACT  nasal spray Place 2 sprays into both nostrils daily.   hydrALAZINE (APRESOLINE) 10 MG tablet Take 1 tablet (10 mg total) by mouth in the morning and at bedtime.   losartan-hydrochlorothiazide (HYZAAR) 100-25 MG tablet Take 1 tablet by mouth daily.   metroNIDAZOLE (FLAGYL) 500 MG tablet Take 1 tablet (500 mg total) by mouth 2 (two) times daily.   No current facility-administered medications on file prior to visit.    Allergies:  Allergies  Allergen Reactions   Sulfa Antibiotics    Sulfacetamide Sodium  Social History:  Social History   Socioeconomic History   Marital status: Divorced    Spouse name: Not on file   Number of children: Not on file   Years of education: Not on file   Highest education level: Not on file  Occupational History   Not on file  Tobacco Use   Smoking status: Never   Smokeless tobacco: Never  Vaping Use   Vaping Use: Never used  Substance and Sexual Activity   Alcohol use: No    Alcohol/week: 0.0 standard drinks of alcohol   Drug use: No   Sexual activity: Yes  Other Topics Concern   Not on file  Social History Narrative   Not on file   Social Determinants of Health   Financial Resource Strain: Not on file  Food Insecurity: Not on file  Transportation Needs: Not on file  Physical Activity: Not on file  Stress: Not on file  Social Connections: Not on file  Intimate Partner Violence: Not on file   Social History   Tobacco Use  Smoking Status Never  Smokeless Tobacco Never   Social History   Substance and Sexual Activity  Alcohol Use No   Alcohol/week: 0.0 standard drinks of alcohol    Family History:  Family History  Problem Relation Age of Onset   Migraines Sister    Seizures Daughter    COPD Daughter    Heart disease Maternal Grandmother    Breast cancer Neg Hx     Past medical history, surgical history, medications, allergies, family history and social history reviewed with patient today and changes made to  appropriate areas of the chart.   ROS All other ROS negative except what is listed above and in the HPI.      Objective:    LMP  (LMP Unknown)   Wt Readings from Last 3 Encounters:  06/07/21 191 lb 12.8 oz (87 kg)  12/05/20 194 lb 2 oz (88.1 kg)  11/02/20 190 lb 4 oz (86.3 kg)    Physical Exam  Results for orders placed or performed in visit on 08/15/21  Urine Culture   Specimen: Urine   UR  Result Value Ref Range   Urine Culture, Routine Final report    Organism ID, Bacteria Comment   Microscopic Examination  Result Value Ref Range   WBC, UA None seen 0 - 5 /hpf   RBC, Urine 0-2 0 - 2 /hpf   Epithelial Cells (non renal) 0-10 0 - 10 /hpf   Mucus, UA Present (A) Not Estab.   Bacteria, UA Moderate (A) None seen/Few  WET PREP FOR TRICH, YEAST, CLUE   Urine  Result Value Ref Range   Trichomonas Exam Negative Negative   Yeast Exam Negative Negative   Clue Cell Exam Positive (A) Negative  Urinalysis, Routine w reflex microscopic  Result Value Ref Range   Specific Gravity, UA >1.030 (H) 1.005 - 1.030   pH, UA 5.5 5.0 - 7.5   Color, UA Yellow Yellow   Appearance Ur Cloudy (A) Clear   Leukocytes,UA Negative Negative   Protein,UA Trace (A) Negative/Trace   Glucose, UA Negative Negative   Ketones, UA Trace (A) Negative   RBC, UA Trace (A) Negative   Bilirubin, UA Negative Negative   Urobilinogen, Ur 0.2 0.2 - 1.0 mg/dL   Nitrite, UA Negative Negative   Microscopic Examination See below:       Assessment & Plan:   Problem List Items Addressed This Visit  Cardiovascular and Mediastinum   Hypertension     Other   Hypertriglyceridemia   Other Visit Diagnoses     Annual physical exam    -  Primary        Follow up plan: No follow-ups on file.   LABORATORY TESTING:  - Pap smear: {Blank TKPTWS:56812::"XNT done","not applicable","up to date","done elsewhere"}  IMMUNIZATIONS:   - Tdap: Tetanus vaccination status reviewed: {tetanus status:315746}. -  Influenza: {Blank single:19197::"Up to date","Administered today","Postponed to flu season","Refused","Given elsewhere"} - Pneumovax: {Blank single:19197::"Up to date","Administered today","Not applicable","Refused","Given elsewhere"} - Prevnar: {Blank single:19197::"Up to date","Administered today","Not applicable","Refused","Given elsewhere"} - COVID: {Blank single:19197::"Up to date","Administered today","Not applicable","Refused","Given elsewhere"} - HPV: {Blank single:19197::"Up to date","Administered today","Not applicable","Refused","Given elsewhere"} - Shingrix vaccine: {Blank single:19197::"Up to date","Administered today","Not applicable","Refused","Given elsewhere"}  SCREENING: -Mammogram: {Blank single:19197::"Up to date","Ordered today","Not applicable","Refused","Done elsewhere"}  - Colonoscopy: {Blank single:19197::"Up to date","Ordered today","Not applicable","Refused","Done elsewhere"}  - Bone Density: {Blank single:19197::"Up to date","Ordered today","Not applicable","Refused","Done elsewhere"}  -Hearing Test: {Blank single:19197::"Up to date","Ordered today","Not applicable","Refused","Done elsewhere"}  -Spirometry: {Blank single:19197::"Up to date","Ordered today","Not applicable","Refused","Done elsewhere"}   PATIENT COUNSELING:   Advised to take 1 mg of folate supplement per day if capable of pregnancy.   Sexuality: Discussed sexually transmitted diseases, partner selection, use of condoms, avoidance of unintended pregnancy  and contraceptive alternatives.   Advised to avoid cigarette smoking.  I discussed with the patient that most people either abstain from alcohol or drink within safe limits (<=14/week and <=4 drinks/occasion for males, <=7/weeks and <= 3 drinks/occasion for females) and that the risk for alcohol disorders and other health effects rises proportionally with the number of drinks per week and how often a drinker exceeds daily limits.  Discussed  cessation/primary prevention of drug use and availability of treatment for abuse.   Diet: Encouraged to adjust caloric intake to maintain  or achieve ideal body weight, to reduce intake of dietary saturated fat and total fat, to limit sodium intake by avoiding high sodium foods and not adding table salt, and to maintain adequate dietary potassium and calcium preferably from fresh fruits, vegetables, and low-fat dairy products.    stressed the importance of regular exercise  Injury prevention: Discussed safety belts, safety helmets, smoke detector, smoking near bedding or upholstery.   Dental health: Discussed importance of regular tooth brushing, flossing, and dental visits.    NEXT PREVENTATIVE PHYSICAL DUE IN 1 YEAR. No follow-ups on file.

## 2021-12-05 ENCOUNTER — Ambulatory Visit (INDEPENDENT_AMBULATORY_CARE_PROVIDER_SITE_OTHER): Payer: 59 | Admitting: Nurse Practitioner

## 2021-12-05 ENCOUNTER — Encounter: Payer: Self-pay | Admitting: Nurse Practitioner

## 2021-12-05 ENCOUNTER — Other Ambulatory Visit: Payer: Self-pay

## 2021-12-05 ENCOUNTER — Telehealth: Payer: Self-pay

## 2021-12-05 VITALS — BP 127/88 | HR 65 | Temp 98.7°F | Ht 67.0 in | Wt 191.5 lb

## 2021-12-05 DIAGNOSIS — Z0000 Encounter for general adult medical examination without abnormal findings: Secondary | ICD-10-CM | POA: Diagnosis not present

## 2021-12-05 DIAGNOSIS — I10 Essential (primary) hypertension: Secondary | ICD-10-CM

## 2021-12-05 DIAGNOSIS — Z1211 Encounter for screening for malignant neoplasm of colon: Secondary | ICD-10-CM

## 2021-12-05 DIAGNOSIS — E781 Pure hyperglyceridemia: Secondary | ICD-10-CM | POA: Diagnosis not present

## 2021-12-05 DIAGNOSIS — Z1231 Encounter for screening mammogram for malignant neoplasm of breast: Secondary | ICD-10-CM | POA: Diagnosis not present

## 2021-12-05 LAB — URINALYSIS, ROUTINE W REFLEX MICROSCOPIC
Bilirubin, UA: NEGATIVE
Glucose, UA: NEGATIVE
Ketones, UA: NEGATIVE
Leukocytes,UA: NEGATIVE
Nitrite, UA: NEGATIVE
Protein,UA: NEGATIVE
RBC, UA: NEGATIVE
Specific Gravity, UA: 1.025 (ref 1.005–1.030)
Urobilinogen, Ur: 0.2 mg/dL (ref 0.2–1.0)
pH, UA: 6 (ref 5.0–7.5)

## 2021-12-05 MED ORDER — LOSARTAN POTASSIUM-HCTZ 100-25 MG PO TABS: 1.0000 | ORAL_TABLET | Freq: Every day | ORAL | 1 refills | Status: DC

## 2021-12-05 MED ORDER — PEG 3350-KCL-NA BICARB-NACL 420 G PO SOLR: 4000.0000 mL | Freq: Once | ORAL | 0 refills | Status: AC

## 2021-12-05 MED ORDER — AMLODIPINE BESYLATE 10 MG PO TABS: 10.0000 mg | ORAL_TABLET | Freq: Every day | ORAL | 1 refills | Status: DC

## 2021-12-05 NOTE — Telephone Encounter (Signed)
Gastroenterology Pre-Procedure Review  Request Date: 09/22 Requesting Physician: Dr. Vicente Males  PATIENT REVIEW QUESTIONS: The patient responded to the following health history questions as indicated:    1. Are you having any GI issues? no 2. Do you have a personal history of Polyps? no 3. Do you have a family history of Colon Cancer or Polyps? no 4. Diabetes Mellitus? no 5. Joint replacements in the past 12 months?no 6. Major health problems in the past 3 months?no 7. Any artificial heart valves, MVP, or defibrillator?no    MEDICATIONS & ALLERGIES:    Patient reports the following regarding taking any anticoagulation/antiplatelet therapy:   Plavix, Coumadin, Eliquis, Xarelto, Lovenox, Pradaxa, Brilinta, or Effient? no Aspirin? no  Patient confirms/reports the following medications:  Current Outpatient Medications  Medication Sig Dispense Refill   amLODipine (NORVASC) 10 MG tablet Take 1 tablet (10 mg total) by mouth daily. 90 tablet 1   diphenhydrAMINE HCl (BENADRYL ALLERGY PO) Take by mouth daily.     fluticasone (FLONASE) 50 MCG/ACT nasal spray Place 2 sprays into both nostrils daily. 16 g 12   losartan-hydrochlorothiazide (HYZAAR) 100-25 MG tablet Take 1 tablet by mouth daily. 90 tablet 1   No current facility-administered medications for this visit.    Patient confirms/reports the following allergies:  Allergies  Allergen Reactions   Sulfa Antibiotics    Sulfacetamide Sodium     No orders of the defined types were placed in this encounter.   AUTHORIZATION INFORMATION Primary Insurance: 1D#: Group #:  Secondary Insurance: 1D#: Group #:  SCHEDULE INFORMATION: Date: 09/22 Time: Location: [place]

## 2021-12-05 NOTE — Progress Notes (Signed)
Colonoscopy bowel prep instruction changed to provide instructions for Golytely instead of Suprep.  Thanks,  [place], [place]

## 2021-12-05 NOTE — Patient Instructions (Signed)
Please call to schedule your mammogram and/or bone density: [facility]  Address: [address], [place], [place] [address] Phone: [phone]  [facility] [place] [address] [place],  [place]  [address] Phone: [phone]   

## 2021-12-05 NOTE — Assessment & Plan Note (Signed)
Labs ordered today.  Will make recommendations based on lab results. ?

## 2021-12-06 LAB — COMPREHENSIVE METABOLIC PANEL
ALT: 11 IU/L (ref 0–32)
AST: 15 IU/L (ref 0–40)
Albumin/Globulin Ratio: 1.5 (ref 1.2–2.2)
Albumin: 4.4 g/dL (ref 3.9–4.9)
Alkaline Phosphatase: 92 IU/L (ref 44–121)
BUN/Creatinine Ratio: 13 (ref 12–28)
BUN: 13 mg/dL (ref 8–27)
Bilirubin Total: 0.3 mg/dL (ref 0.0–1.2)
CO2: 24 mmol/L (ref 20–29)
Calcium: 9.8 mg/dL (ref 8.7–10.3)
Chloride: 102 mmol/L (ref 96–106)
Creatinine, Ser: 1.03 mg/dL — ABNORMAL HIGH (ref 0.57–1.00)
Globulin, Total: 2.9 g/dL (ref 1.5–4.5)
Glucose: 92 mg/dL (ref 70–99)
Potassium: 3.8 mmol/L (ref 3.5–5.2)
Sodium: 141 mmol/L (ref 134–144)
Total Protein: 7.3 g/dL (ref 6.0–8.5)
eGFR: 62 mL/min/{1.73_m2} (ref >59)

## 2021-12-06 LAB — CBC WITH DIFFERENTIAL/PLATELET
Basophils Absolute: 0 10*3/uL (ref 0.0–0.2)
Basos: 1 %
EOS (ABSOLUTE): 0.2 10*3/uL (ref 0.0–0.4)
Eos: 5 %
Hematocrit: 39.2 % (ref 34.0–46.6)
Hemoglobin: 13.1 g/dL (ref 11.1–15.9)
Immature Grans (Abs): 0 10*3/uL (ref 0.0–0.1)
Immature Granulocytes: 0 %
Lymphocytes Absolute: 1.4 10*3/uL (ref 0.7–3.1)
Lymphs: 35 %
MCH: 27.8 pg (ref 26.6–33.0)
MCHC: 33.4 g/dL (ref 31.5–35.7)
MCV: 83 fL (ref 79–97)
Monocytes Absolute: 0.4 10*3/uL (ref 0.1–0.9)
Monocytes: 9 %
Neutrophils Absolute: 2.1 10*3/uL (ref 1.4–7.0)
Neutrophils: 50 %
Platelets: 215 10*3/uL (ref 150–450)
RBC: 4.71 x10E6/uL (ref 3.77–5.28)
RDW: 13.7 % (ref 11.7–15.4)
WBC: 4.1 10*3/uL (ref 3.4–10.8)

## 2021-12-06 LAB — LIPID PANEL
Chol/HDL Ratio: 2.8 ratio (ref 0.0–4.4)
Cholesterol, Total: 151 mg/dL (ref 100–199)
HDL: 54 mg/dL (ref >39)
LDL Chol Calc (NIH): 77 mg/dL (ref 0–99)
Triglycerides: 113 mg/dL (ref 0–149)
VLDL Cholesterol Cal: 20 mg/dL (ref 5–40)

## 2021-12-06 LAB — TSH: TSH: 1.65 u[IU]/mL (ref 0.450–4.500)

## 2021-12-06 NOTE — Progress Notes (Signed)
Hi Nichole Palmer. It was nice to see you yesterday.  Your lab work looks good.  Your kidney function has improved some which is great news.  No concerns at this time. Continue with your current medication regimen.  Follow up as discussed.  Please let me know if you have any questions.

## 2021-12-12 IMAGING — MG MM DIGITAL DIAGNOSTIC UNILAT*L* W/ TOMO W/ CAD
4 series · 4 of 12 positions shown · non-contrast
Comparison: Previous exam(s).

CLINICAL DATA: Callback for possible LEFT breast distortion seen on
MLO view only

EXAM:
DIGITAL DIAGNOSTIC UNILATERAL LEFT MAMMOGRAM WITH TOMOSYNTHESIS AND
CAD
TECHNIQUE: Left digital diagnostic mammography and breast tomosynthesis was
performed. The images were evaluated with computer-aided detection.

[L MLO synth-2D]
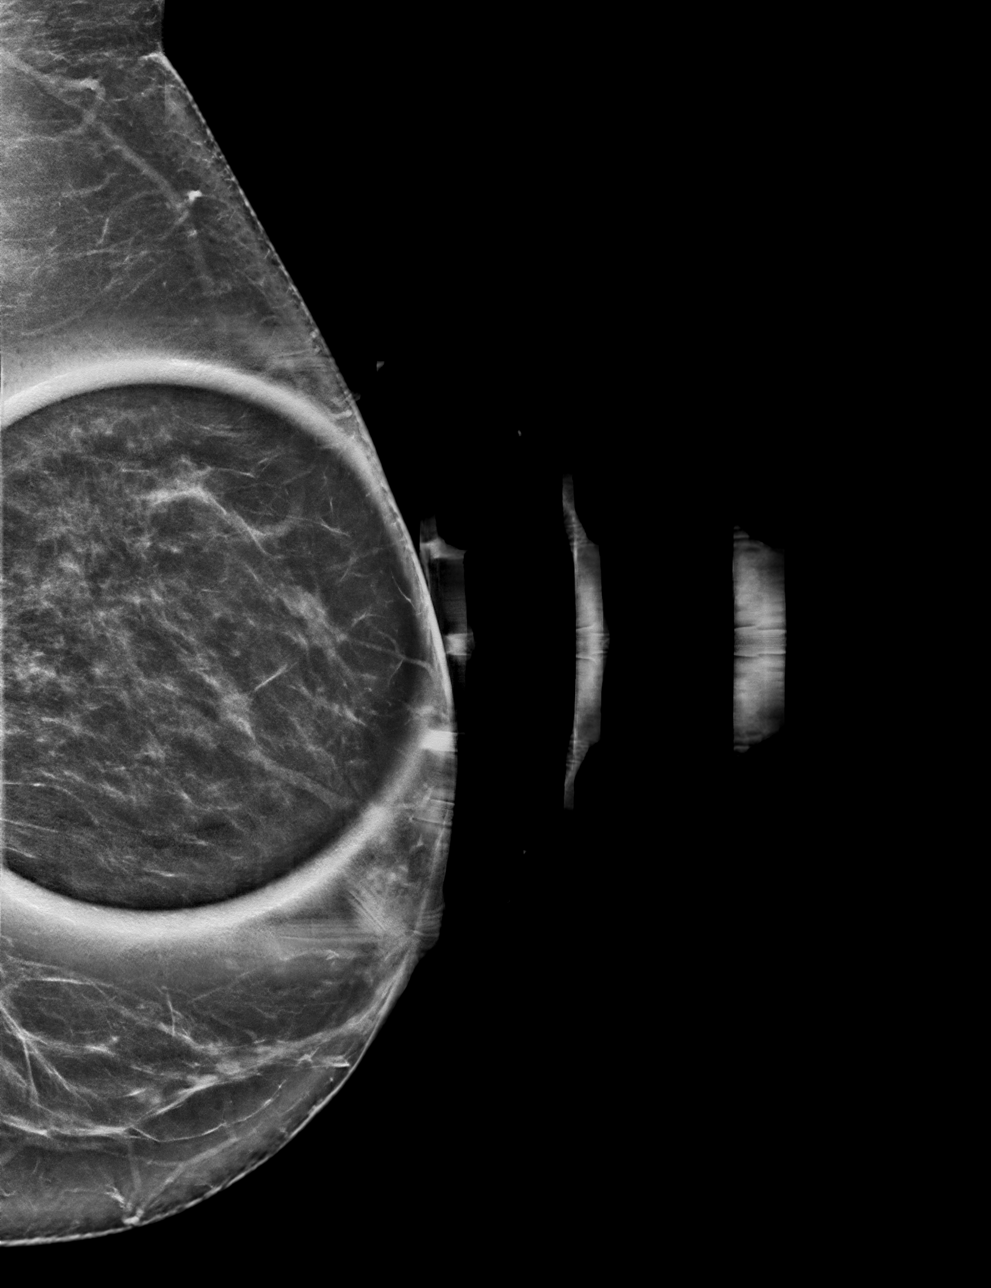

[L ML synth-2D]
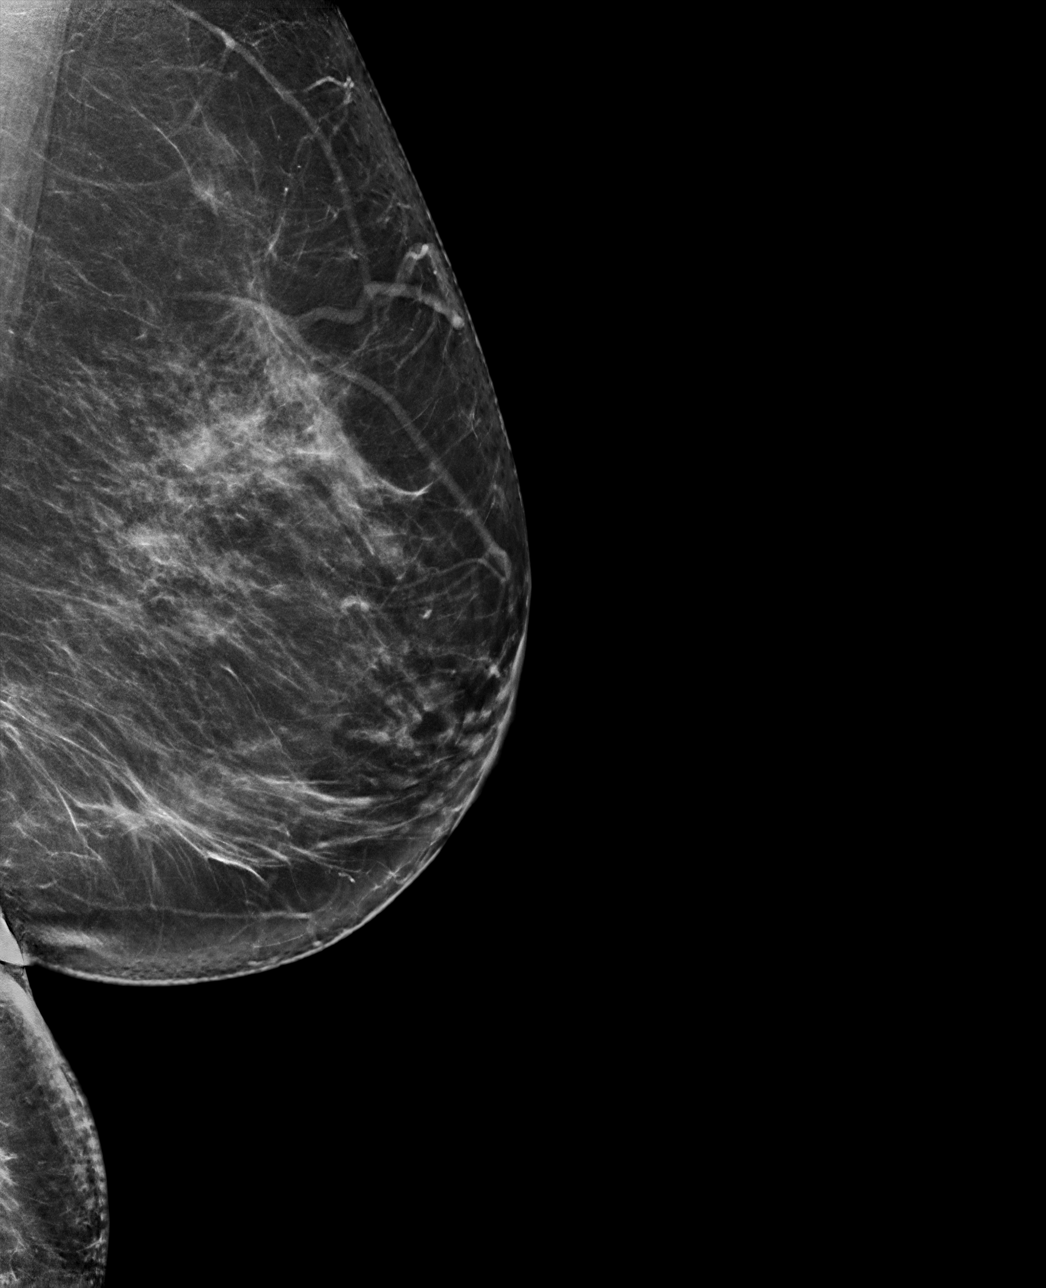

[L ML tomo · tomo slice 44/87.0]
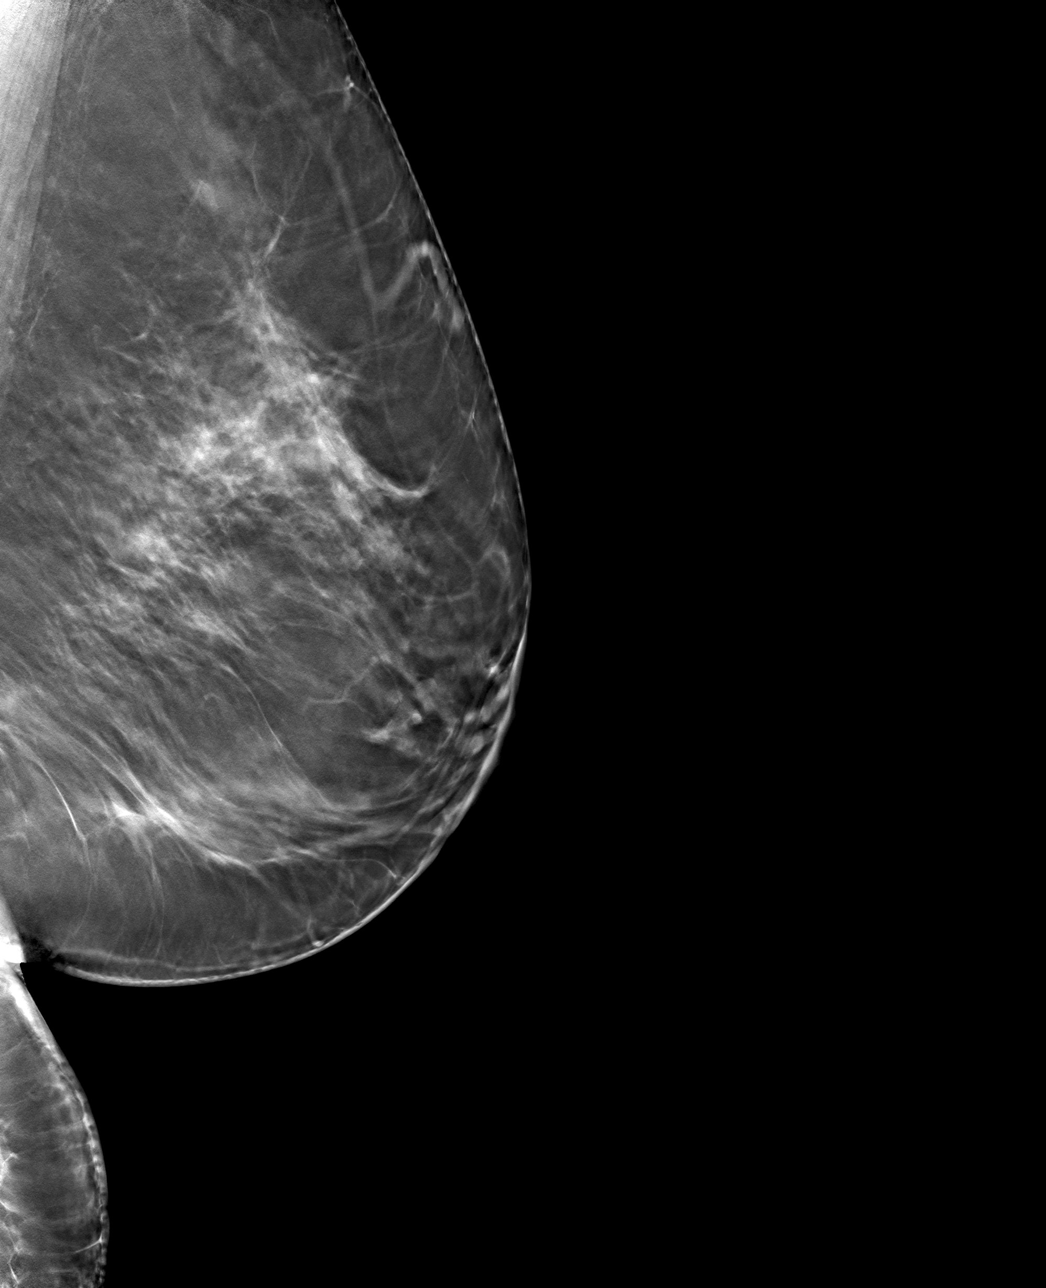

[L MLO tomo · tomo slice 43/85.0]
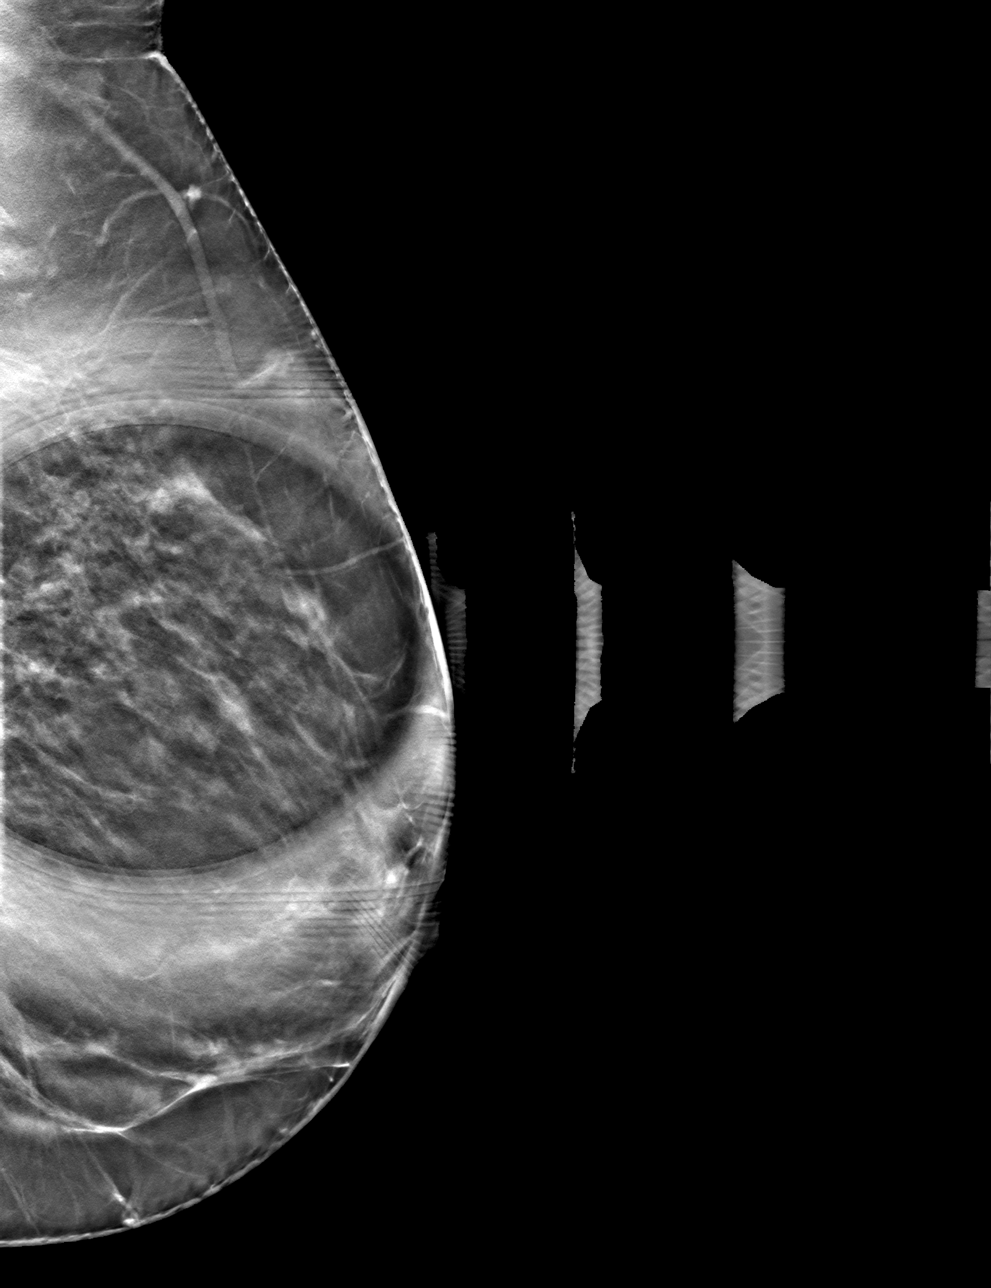

[4 of 12 positions shown; findings below may reference images not displayed]

ACR Breast Density Category b: There are scattered areas of
fibroglandular density.
FINDINGS: The previously described finding does not persist with additional
views, consistent with superimposed fibroglandular tissue.
Fibroglandular tissue assumes a configuration stable in comparison
to remote prior mammograms. No suspicious mass, microcalcification,
or other finding is identified.
IMPRESSION: No mammographic evidence of malignancy.

RECOMMENDATION:
Screening mammogram in one year.(Code:[id number])

I have discussed the findings and recommendations with the patient.
If applicable, a reminder letter will be sent to the patient
regarding the next appointment.

BI-RADS CATEGORY  1: Negative.

## 2022-01-16 ENCOUNTER — Telehealth: Payer: Self-pay | Admitting: Gastroenterology

## 2022-01-16 NOTE — Telephone Encounter (Signed)
Patient called to get confirmation that her bowel prep was sent to [facility] on [address]. Requests call back.

## 2022-01-18 ENCOUNTER — Encounter: Payer: Self-pay | Admitting: Gastroenterology

## 2022-01-19 ENCOUNTER — Ambulatory Visit
Admission: RE | Admit: 2022-01-19 | Discharge: 2022-01-19 | Disposition: A | Discharge status: Home / Self Care | Payer: 59 | Attending: Gastroenterology | Admitting: Gastroenterology

## 2022-01-19 ENCOUNTER — Ambulatory Visit: Payer: 59 | Admitting: Anesthesiology

## 2022-01-19 ENCOUNTER — Encounter: Payer: Self-pay | Admitting: Gastroenterology

## 2022-01-19 ENCOUNTER — Encounter
Admission: RE | Disposition: A | Discharge status: Home / Self Care | Payer: Self-pay | Source: Home / Self Care | Attending: Gastroenterology

## 2022-01-19 DIAGNOSIS — D126 Benign neoplasm of colon, unspecified: Secondary | ICD-10-CM

## 2022-01-19 DIAGNOSIS — D122 Benign neoplasm of ascending colon: Secondary | ICD-10-CM | POA: Insufficient documentation

## 2022-01-19 DIAGNOSIS — Z1211 Encounter for screening for malignant neoplasm of colon: Secondary | ICD-10-CM | POA: Insufficient documentation

## 2022-01-19 DIAGNOSIS — I10 Essential (primary) hypertension: Secondary | ICD-10-CM | POA: Insufficient documentation

## 2022-01-19 HISTORY — PX: COLONOSCOPY WITH PROPOFOL: SHX5780

## 2022-01-19 SURGERY — COLONOSCOPY WITH PROPOFOL
Anesthesia: General

## 2022-01-19 MED ORDER — LIDOCAINE HCL (PF) 2 % IJ SOLN
INTRAMUSCULAR | Status: AC
  Filled 2022-01-19: qty 5

## 2022-01-19 MED ORDER — LIDOCAINE 2% (20 MG/ML) 5 ML SYRINGE
INTRAMUSCULAR | Status: DC | PRN
  Administered 2022-01-19: 50 mg via INTRAVENOUS

## 2022-01-19 MED ORDER — SODIUM CHLORIDE 0.9 % IV SOLN: INTRAVENOUS | Status: DC

## 2022-01-19 MED ORDER — PROPOFOL 10 MG/ML IV BOLUS
INTRAVENOUS | Status: DC | PRN
  Administered 2022-01-19: 80 mg via INTRAVENOUS
  Administered 2022-01-19: 20 mg via INTRAVENOUS

## 2022-01-19 MED ORDER — PROPOFOL 500 MG/50ML IV EMUL
INTRAVENOUS | Status: DC | PRN
  Administered 2022-01-19: 150 ug/kg/min via INTRAVENOUS

## 2022-01-19 MED ORDER — PROPOFOL 1000 MG/100ML IV EMUL
INTRAVENOUS | Status: AC
  Filled 2022-01-19: qty 100

## 2022-01-19 NOTE — H&P (Signed)
     Jonathon Bellows, MD [address] [place], [place], [place], [address] [address], [place], [place], [place], [address] Phone: [phone]  Fax: [phone]  Primary Care Physician:  Jon Billings, NP   Pre-Procedure History & Physical: HPI:  Nichole Palmer is a 61 y.o. female is here for an colonoscopy.   Past Medical History:  Diagnosis Date   Hypertension     Past Surgical History:  Procedure Laterality Date   TOTAL ABDOMINAL HYSTERECTOMY     partial    Prior to Admission medications   Medication Sig Start Date End Date Taking? Authorizing Provider  amLODipine (NORVASC) 10 MG tablet Take 1 tablet (10 mg total) by mouth daily. 12/05/21  Yes Jon Billings, NP  losartan-hydrochlorothiazide (HYZAAR) 100-25 MG tablet Take 1 tablet by mouth daily. 12/05/21  Yes Jon Billings, NP  diphenhydrAMINE HCl (BENADRYL ALLERGY PO) Take by mouth daily.    [provider]  fluticasone (FLONASE) 50 MCG/ACT nasal spray Place 2 sprays into both nostrils daily. 07/30/18   Volney American, PA-C    Allergies as of 12/06/2021 - Review Complete 12/05/2021  Allergen Reaction Noted   Sulfa antibiotics  11/09/2014   Sulfacetamide sodium  03/07/2015    Family History  Problem Relation Age of Onset   Migraines Sister    Seizures Daughter    COPD Daughter    Heart disease Maternal Grandmother    Breast cancer Neg Hx     Social History   Socioeconomic History   Marital status: Divorced    Spouse name: Not on file   Number of children: Not on file   Years of education: Not on file   Highest education level: Not on file  Occupational History   Not on file  Tobacco Use   Smoking status: Never   Smokeless tobacco: Never  Vaping Use   Vaping Use: Never used  Substance and Sexual Activity   Alcohol use: Yes    Comment: social   Drug use: No   Sexual activity: Yes  Other Topics Concern   Not on file  Social History Narrative   Not on file   Social  Determinants of Health   Financial Resource Strain: Not on file  Food Insecurity: Not on file  Transportation Needs: Not on file  Physical Activity: Not on file  Stress: Not on file  Social Connections: Not on file  Intimate Partner Violence: Not on file    Review of Systems: See HPI, otherwise negative ROS  Physical Exam: LMP  (LMP Unknown)  General:   Alert,  pleasant and cooperative in NAD Head:  Normocephalic and atraumatic. Neck:  Supple; no masses or thyromegaly. Lungs:  Clear throughout to auscultation, normal respiratory effort.    Heart:  +S1, +S2, Regular rate and rhythm, No edema. Abdomen:  Soft, nontender and nondistended. Normal bowel sounds, without guarding, and without rebound.   Neurologic:  Alert and  oriented x4;  grossly normal neurologically.  Impression/Plan: Nichole Palmer is here for an colonoscopy to be performed for Screening colonoscopy average risk   Risks, benefits, limitations, and alternatives regarding  colonoscopy have been reviewed with the patient.  Questions have been answered.  All parties agreeable.   Jonathon Bellows, MD  01/19/2022, 8:14 AM

## 2022-01-19 NOTE — Op Note (Signed)
[facility] Gastroenterology Patient Name: Linzi Ohlinger Procedure Date: 01/19/2022 8:38 AM MRN: [id number] Account #: [id number] Date of Birth: [birth year] Admit Type: Outpatient Age: 61 Room: [facility] ENDO ROOM 4 Gender: Female Note Status: Finalized Instrument Name: Jasper Riling [id number] Procedure:             Colonoscopy Indications:           Screening for colorectal malignant neoplasm Providers:             Jonathon Bellows MD, MD Referring MD:          No Local Md, MD (Referring MD) Medicines:             Monitored Anesthesia Care Complications:         No immediate complications. Procedure:             Pre-Anesthesia Assessment:                        - Prior to the procedure, a History and Physical was                         performed, and patient medications, allergies and                         sensitivities were reviewed. The patient's tolerance                         of previous anesthesia was reviewed.                        - The risks and benefits of the procedure and the                         sedation options and risks were discussed with the                         patient. All questions were answered and informed                         consent was obtained.                        - ASA Grade Assessment: II - A patient with mild                         systemic disease.                        After obtaining informed consent, the colonoscope was                         passed under direct vision. Throughout the procedure,                         the patient's blood pressure, pulse, and oxygen                         saturations were monitored continuously. The                         Colonoscope was  introduced through the anus and                         advanced to the the cecum, identified by the                         appendiceal orifice. The colonoscopy was performed                         with ease. The patient tolerated the procedure well.                          The quality of the bowel preparation was good. Findings:      The perianal and digital rectal examinations were normal.      A 3 mm polyp was found in the ascending colon. The polyp was sessile.       The polyp was removed with a cold biopsy forceps. Resection and       retrieval were complete.      The exam was otherwise without abnormality on direct and retroflexion       views. Impression:            - One 3 mm polyp in the ascending colon, removed with                         a cold biopsy forceps. Resected and retrieved.                        - The examination was otherwise normal on direct and                         retroflexion views. Recommendation:        - Discharge patient to home (with escort).                        - Resume previous diet.                        - Continue present medications.                        - Await pathology results.                        - Repeat colonoscopy for surveillance based on                         pathology results. Procedure Code(s):     --- Professional ---                        [phone], Colonoscopy, flexible; with biopsy, single or                         multiple Diagnosis Code(s):     --- Professional ---                        K63.5, Polyp of colon  Z12.11, Encounter for screening for malignant neoplasm                         of colon CPT copyright 2019 [employer]. All rights reserved. The codes documented in this report are preliminary and upon coder review may  be revised to meet current compliance requirements. Jonathon Bellows, MD Jonathon Bellows MD, MD 01/19/2022 8:54:54 AM This report has been signed electronically. Number of Addenda: 0 Note Initiated On: 01/19/2022 8:38 AM Scope Withdrawal Time: 0 hours 8 minutes 4 seconds  Total Procedure Duration: 0 hours 10 minutes 52 seconds  Estimated Blood Loss:  Estimated blood loss: none.      [facility]

## 2022-01-19 NOTE — Transfer of Care (Signed)
Immediate Anesthesia Transfer of Care Note  Patient: Nichole Palmer  Procedure(s) Performed: COLONOSCOPY WITH PROPOFOL  Patient Location: Endoscopy Unit  Anesthesia Type:General  Level of Consciousness: awake, alert  and oriented  Airway & Oxygen Therapy: Patient Spontanous Breathing  Post-op Assessment: Report given to RN and Post -op Vital signs reviewed and stable  Post vital signs: Reviewed and stable  Last Vitals:  Vitals Value Taken Time  BP 150/102   Temp    Pulse 84   Resp 12   SpO2 98     Last Pain:  Vitals:   01/19/22 0856  TempSrc: Tympanic  PainSc: Asleep         Complications: No notable events documented.

## 2022-01-19 NOTE — Anesthesia Postprocedure Evaluation (Signed)
Anesthesia Post Note  Patient: Kasen Sako  Procedure(s) Performed: COLONOSCOPY WITH PROPOFOL  Patient location during evaluation: PACU Anesthesia Type: General Level of consciousness: awake and alert, oriented and patient cooperative Pain management: pain level controlled Vital Signs Assessment: post-procedure vital signs reviewed and stable Respiratory status: spontaneous breathing, nonlabored ventilation and respiratory function stable Cardiovascular status: blood pressure returned to baseline and stable Postop Assessment: adequate PO intake Anesthetic complications: no   No notable events documented.   Last Vitals:  Vitals:   01/19/22 0906 01/19/22 0916  BP: (!) 162/98 (!) 191/102  Pulse: 63 60  Resp: 19 17  Temp:    SpO2: 99% 100%    Last Pain:  Vitals:   01/19/22 0916  TempSrc:   PainSc: 0-No pain                 Darrin Nipper

## 2022-01-19 NOTE — Anesthesia Preprocedure Evaluation (Addendum)
Anesthesia Evaluation  Patient identified by MRN, date of birth, ID band Patient awake    Reviewed: Allergy & Precautions, NPO status , Patient's Chart, lab work & pertinent test results  History of Anesthesia Complications Negative for: history of anesthetic complications  Airway Mallampati: III   Neck ROM: Full    Dental  (+) Missing   Pulmonary neg pulmonary ROS,    Pulmonary exam normal breath sounds clear to auscultation       Cardiovascular hypertension, Normal cardiovascular exam Rhythm:Regular Rate:Normal     Neuro/Psych negative neurological ROS     GI/Hepatic negative GI ROS,   Endo/Other  negative endocrine ROS  Renal/GU negative Renal ROS     Musculoskeletal   Abdominal   Peds  Hematology negative hematology ROS (+)   Anesthesia Other Findings   Reproductive/Obstetrics                            Anesthesia Physical Anesthesia Plan  ASA: 2  Anesthesia Plan: General   Post-op Pain Management:    Induction: Intravenous  PONV Risk Score and Plan: 3 and Propofol infusion, TIVA and Treatment may vary due to age or medical condition  Airway Management Planned: Natural Airway  Additional Equipment:   Intra-op Plan:   Post-operative Plan:   Informed Consent: I have reviewed the patients History and Physical, chart, labs and discussed the procedure including the risks, benefits and alternatives for the proposed anesthesia with the patient or authorized representative who has indicated his/her understanding and acceptance.       Plan Discussed with: CRNA  Anesthesia Plan Comments: (LMA/GETA backup discussed.  Patient consented for risks of anesthesia including but not limited to:  - adverse reactions to medications - damage to eyes, teeth, lips or other oral mucosa - nerve damage due to positioning  - sore throat or hoarseness - damage to heart, brain, nerves,  lungs, other parts of body or loss of life  Informed patient about role of CRNA in peri- and intra-operative care.  Patient voiced understanding.)        Anesthesia Quick Evaluation

## 2022-01-22 ENCOUNTER — Encounter: Payer: Self-pay | Admitting: Gastroenterology

## 2022-01-22 LAB — SURGICAL PATHOLOGY

## 2022-01-23 ENCOUNTER — Encounter: Payer: Self-pay | Admitting: Gastroenterology

## 2022-01-31 ENCOUNTER — Ambulatory Visit
Admission: RE | Admit: 2022-01-31 | Discharge: 2022-01-31 | Disposition: A | Discharge status: Home / Self Care | Payer: 59 | Source: Ambulatory Visit | Attending: Nurse Practitioner | Admitting: Nurse Practitioner

## 2022-01-31 DIAGNOSIS — Z1231 Encounter for screening mammogram for malignant neoplasm of breast: Secondary | ICD-10-CM | POA: Insufficient documentation

## 2022-02-01 NOTE — Progress Notes (Signed)
Please let patient know her Mammogram did not show any evidence of a malignancy.  The recommendation is to repeat the Mammogram in 1 year.  

## 2022-05-11 ENCOUNTER — Other Ambulatory Visit: Payer: Self-pay | Admitting: Nurse Practitioner

## 2022-05-11 NOTE — Telephone Encounter (Signed)
Medication Refill - Medication: fluticasone (FLONASE) 50 MCG/ACT nasal spray   Has the patient contacted their pharmacy? Yes.     Pt need to reach out to PCP   Preferred Pharmacy (with phone number or street name):  [id number] [place]: [phone] Fax: [phone]    Has the patient been seen for an appointment in the last year OR does the patient have an upcoming appointment? Yes.    Agent: Please be advised that RX refills may take up to 3 business days. We ask that you follow-up with your pharmacy.

## 2022-05-11 NOTE — Telephone Encounter (Signed)
Requested medication (s) are due for refill today: routing for review  Requested medication (s) are on the active medication list: yes  Last refill:  07/30/18  Future visit scheduled: yes  Notes to clinic:  Unable to refill per protocol, last refill by another provider.      Requested Prescriptions  Pending Prescriptions Disp Refills   fluticasone (FLONASE) 50 MCG/ACT nasal spray 16 g 12    Sig: Place 2 sprays into both nostrils daily.     Ear, Nose, and Throat: Nasal Preparations - Corticosteroids Passed - 05/11/2022  9:30 AM      Passed - Valid encounter within last 12 months    Recent Outpatient Visits           5 months ago Annual physical exam   [facility] Jon Billings, NP   8 months ago BV (bacterial vaginosis)   [place], [place], DO   11 months ago Primary hypertension   [facility] Jon Billings, NP   1 year ago Right foot pain   [facility] Jon Billings, NP   1 year ago Primary hypertension   [facility] Jon Billings, NP       Future Appointments             In 3 weeks Jon Billings, NP [facility] [place]

## 2022-05-14 MED ORDER — FLUTICASONE PROPIONATE 50 MCG/ACT NA SUSP: 2.0000 | Freq: Every day | NASAL | 12 refills | Status: AC

## 2022-06-07 ENCOUNTER — Ambulatory Visit: Payer: Managed Care, Other (non HMO) | Admitting: Nurse Practitioner

## 2023-01-03 ENCOUNTER — Ambulatory Visit: Payer: Managed Care, Other (non HMO) | Admitting: Physician Assistant

## 2023-01-03 ENCOUNTER — Encounter: Payer: Self-pay | Admitting: Physician Assistant

## 2023-01-03 VITALS — BP 152/98 | HR 62 | Ht 67.0 in | Wt 188.6 lb

## 2023-01-03 DIAGNOSIS — Z113 Encounter for screening for infections with a predominantly sexual mode of transmission: Secondary | ICD-10-CM | POA: Diagnosis not present

## 2023-01-03 DIAGNOSIS — Z7189 Other specified counseling: Secondary | ICD-10-CM | POA: Diagnosis not present

## 2023-01-03 DIAGNOSIS — Z0000 Encounter for general adult medical examination without abnormal findings: Secondary | ICD-10-CM | POA: Diagnosis not present

## 2023-01-03 DIAGNOSIS — I10 Essential (primary) hypertension: Secondary | ICD-10-CM

## 2023-01-03 DIAGNOSIS — E781 Pure hyperglyceridemia: Secondary | ICD-10-CM

## 2023-01-03 LAB — WET PREP FOR TRICH, YEAST, CLUE
Clue Cell Exam: NEGATIVE
Trichomonas Exam: NEGATIVE
Yeast Exam: NEGATIVE

## 2023-01-03 MED ORDER — AMLODIPINE BESYLATE 10 MG PO TABS: 10.0000 mg | ORAL_TABLET | Freq: Every day | ORAL | 1 refills | Status: DC

## 2023-01-03 MED ORDER — LOSARTAN POTASSIUM 100 MG PO TABS: 100.0000 mg | ORAL_TABLET | Freq: Every day | ORAL | 1 refills | Status: DC

## 2023-01-03 NOTE — Assessment & Plan Note (Signed)
Chronic, appears undertreated at this time She is only taking Amlodipine 10 mg PO every day right now as she reports getting back pain with Losartan-hydrochlorothiazide so she stopped taking it Suspect this may be muscular in nature- likely cramping so will add Losartan 100 mg PO every day to regimen only  Recommend checking BP at home daily and recording it for review at follow up Follow up in 4 weeks to recheck BP and assess response to med changes

## 2023-01-03 NOTE — Patient Instructions (Signed)
It was nice to meet you and I appreciate the opportunity to be involved in your care If you were satisfied with the care you received from me, I would greatly appreciate you saying so in the after-visit survey that is sent out following our visit.   

## 2023-01-03 NOTE — Progress Notes (Signed)
Annual Physical Exam   Name: Nichole Palmer   MRN: [id number]    DOB: [birth year]   Date:01/03/2023  Today's Provider: Jacquelin Hawking, MHS, PA-C Introduced myself to the patient as a PA-C and provided education on APPs in clinical practice.         Subjective  Chief Complaint  Chief Complaint  Patient presents with   Annual Exam    Patient says she has stopped taking Losartan-HTCZ due to it causing her some back and muscle pain. Patient says she has recently also been under a lot of stress due to losing her grandson.     HPI  Patient presents for annual CPE.  Diet: Overall normal diet- she endorses well balanced diet  Exercise: She is engaged in regular exercise   Sleep:"sometimes it is okay, sometimes it is off, pretty much okay" Getting about 6-8 hours per night  Mood: She reports her mood has been "okay", she recently lost her grandson so she has been a bit "emotional" and has had crying spells     HYPERTENSION / HYPERLIPIDEMIA Satisfied with current treatment? no Duration of hypertension: years BP monitoring frequency: a few times a month BP range: 100/80  BP medication side effects: Reports back pain with Losartan- hydrochlorothiazide  Past BP meds: amlodipine 10 mg PO every day  Duration of hyperlipidemia: years Cholesterol medication side effects: NA Cholesterol supplements: none Past cholesterol medications: none Medication compliance: good compliance Aspirin: no Recent stressors: yes Recurrent headaches: no Visual changes: no Palpitations: no Dyspnea: no Chest pain: no Lower extremity edema: no Dizzy/lightheaded: no    Depression: Phq 9 is  negative    01/03/2023    8:28 AM 12/05/2021    8:36 AM 08/15/2021    2:17 PM 06/07/2021    3:55 PM 09/29/2020    3:39 PM  Depression screen PHQ 2/9  Decreased Interest 0 0 0 0 0  Down, Depressed, Hopeless 0 0 0 0 0  PHQ - 2 Score 0 0 0 0 0  Altered sleeping 0 0 0 1   Tired, decreased energy 0 0 0 0   Change  in appetite 1 0 0 1   Feeling bad or failure about yourself  0 0 0 0   Trouble concentrating 0 0 0 0   Moving slowly or fidgety/restless 0 0 0 0   Suicidal thoughts 0 0 0 0   PHQ-9 Score 1 0 0 2   Difficult doing work/chores Not difficult at all Not difficult at all  Not difficult at all    Hypertension: BP Readings from Last 3 Encounters:  01/03/23 (!) 152/98  01/19/22 (!) 191/102  12/05/21 127/88   Obesity: Wt Readings from Last 3 Encounters:  01/03/23 188 lb 9.6 oz (85.5 kg)  01/19/22 185 lb (83.9 kg)  12/05/21 191 lb 8 oz (86.9 kg)   BMI Readings from Last 3 Encounters:  01/03/23 29.54 kg/m  01/19/22 28.98 kg/m  12/05/21 29.99 kg/m     Vaccines:   HPV:  aged out  Tdap: UTD Shingrix: Overdue Discussed importance of vaccine for disease prevention, patient questions answered and education materials  offered. She declines this today  Pneumonia: Overdue Discussed importance of vaccine for disease prevention, patient questions answered and education materials offered.  Flu: offered flu vaccine today - recommend getting in the next 1-2 months  COVID-19: Discussed vaccine and booster recommendations per available CDC guidelines     Hep C Screening: completed  HIV screening: completed  STD testing and prevention (HIV/chl/gon/syphilis): would like screening today  Intimate partner violence: negative Sexual History: She is sexually active with single monogamous female partner - husband  Menstrual History/LMP/Abnormal Bleeding: she has had hysterectomy  Discussed importance of follow up if any post-menopausal bleeding: not applicable Incontinence Symptoms: No.  Breast cancer hx:  - Last Mammogram: UTD- next due in 2025 - BRCA gene screening:   Osteoporosis Prevention : Discussed high calcium and vitamin D supplementation, weight bearing exercises Bone density :not applicable  Cervical cancer screening: discontinued   Skin cancer: Discussed monitoring for atypical  lesions  Colorectal cancer screening: UTD- next due in 2030   Lung cancer:  Low Dose CT Chest recommended if Age 63-63 years, 20 pack-year currently smoking OR have quit w/in 15years. Patient does not qualify.   ECG: NA  Advanced Care Planning: A voluntary discussion about advance care planning including the explanation and discussion of advance directives.  Discussed health care proxy and Living will, and the patient was able to identify a health care proxy as no one.  Patient does not have a living will in effect.  Lipids: Lab Results  Component Value Date   CHOL 151 12/05/2021   CHOL 158 06/07/2021   CHOL 161 09/29/2020   Lab Results  Component Value Date   HDL 54 12/05/2021   HDL 53 06/07/2021   HDL 49 09/29/2020   Lab Results  Component Value Date   LDLCALC 77 12/05/2021   LDLCALC 80 06/07/2021   LDLCALC 76 09/29/2020   Lab Results  Component Value Date   TRIG 113 12/05/2021   TRIG 145 06/07/2021   TRIG 217 (H) 09/29/2020   Lab Results  Component Value Date   CHOLHDL 2.8 12/05/2021   CHOLHDL 3.0 06/07/2021   CHOLHDL 3.3 09/29/2020   No results found for: "LDLDIRECT"  Glucose: Glucose  Date Value Ref Range Status  12/05/2021 92 70 - 99 mg/dL Final  16/01/9603 540 (H) 70 - 99 mg/dL Final  98/02/9146 86 65 - 99 mg/dL Final    Patient Active Problem List   Diagnosis Date Noted   Advanced care planning/counseling discussion 01/03/2023   Encounter for screening colonoscopy    Adenomatous polyp of colon    Hypertriglyceridemia 06/07/2021   Hypertension 11/09/2014   Menopausal and perimenopausal disorder 11/09/2014   Allergic rhinitis 11/09/2014   Atrophic vaginitis 11/09/2014    Past Surgical History:  Procedure Laterality Date   COLONOSCOPY WITH PROPOFOL N/A 01/19/2022   Procedure: COLONOSCOPY WITH PROPOFOL;  Surgeon: Wyline Mood, MD;  Location: [facility] ENDOSCOPY;  Service: Gastroenterology;  Laterality: N/A;   TOTAL ABDOMINAL HYSTERECTOMY     partial     Family History  Problem Relation Age of Onset   Migraines Sister    Seizures Daughter    COPD Daughter    Heart disease Maternal Grandmother    Breast cancer Neg Hx     Social History   Socioeconomic History   Marital status: Divorced    Spouse name: Not on file   Number of children: Not on file   Years of education: Not on file   Highest education level: Not on file  Occupational History   Not on file  Tobacco Use   Smoking status: Never   Smokeless tobacco: Never  Vaping Use   Vaping status: Never Used  Substance and Sexual Activity   Alcohol use: Yes    Comment: social   Drug use: No   Sexual activity:  Yes  Other Topics Concern   Not on file  Social History Narrative   Not on file   Social Determinants of Health   Financial Resource Strain: Not on file  Food Insecurity: Not on file  Transportation Needs: Not on file  Physical Activity: Not on file  Stress: Not on file  Social Connections: Not on file  Intimate Partner Violence: Not on file     Current Outpatient Medications:    diphenhydrAMINE HCl (BENADRYL ALLERGY PO), Take by mouth daily., Disp: , Rfl:    fluticasone (FLONASE) 50 MCG/ACT nasal spray, Place 2 sprays into both nostrils daily., Disp: 16 g, Rfl: 12   losartan (COZAAR) 100 MG tablet, Take 1 tablet (100 mg total) by mouth daily., Disp: 90 tablet, Rfl: 1   amLODipine (NORVASC) 10 MG tablet, Take 1 tablet (10 mg total) by mouth daily., Disp: 90 tablet, Rfl: 1  Allergies  Allergen Reactions   Sulfa Antibiotics    Sulfacetamide Sodium      Review of Systems  Constitutional:  Negative for chills, fever, malaise/fatigue and weight loss.  HENT:  Negative for hearing loss, nosebleeds, sore throat and tinnitus.   Eyes:  Negative for blurred vision, double vision and photophobia.  Respiratory:  Negative for shortness of breath and wheezing.   Cardiovascular:  Negative for chest pain, palpitations and leg swelling.  Gastrointestinal:   Negative for blood in stool, constipation, diarrhea, heartburn, nausea and vomiting.  Genitourinary:  Negative for dysuria.  Musculoskeletal:  Positive for back pain. Negative for falls, joint pain and myalgias.  Skin:  Positive for rash. Negative for itching.  Neurological:  Negative for dizziness, tingling, tremors, loss of consciousness, weakness and headaches.  Psychiatric/Behavioral:  Negative for depression, memory loss and suicidal ideas. The patient is not nervous/anxious.       Objective  Vitals:   01/03/23 0823 01/03/23 0827 01/03/23 0855  BP: (!) 173/100 (!) 178/93 (!) 152/98  Pulse: 66 62   SpO2: 98%    Weight: 188 lb 9.6 oz (85.5 kg)    Height: 5\' 7"  (1.702 m)      Body mass index is 29.54 kg/m.  Physical Exam Vitals reviewed.  Constitutional:      General: She is awake.     Appearance: Normal appearance. She is well-developed and well-groomed.  HENT:     Head: Normocephalic and atraumatic.     Right Ear: Hearing, tympanic membrane and ear canal normal.     Left Ear: Hearing, tympanic membrane and ear canal normal.     Mouth/Throat:     Lips: Pink.     Mouth: Mucous membranes are moist.     Pharynx: Uvula midline. No pharyngeal swelling, oropharyngeal exudate, posterior oropharyngeal erythema or uvula swelling.     Tonsils: No tonsillar exudate.  Neck:     Thyroid: No thyroid mass, thyromegaly or thyroid tenderness.  Cardiovascular:     Rate and Rhythm: Normal rate and regular rhythm.     Pulses: Normal pulses.          Radial pulses are 2+ on the right side and 2+ on the left side.     Heart sounds: Normal heart sounds. No murmur heard.    No friction rub. No gallop.  Pulmonary:     Effort: Pulmonary effort is normal.     Breath sounds: Normal breath sounds. No decreased air movement. No decreased breath sounds, wheezing, rhonchi or rales.  Abdominal:     General: Abdomen is flat. Bowel  sounds are normal.     Palpations: Abdomen is soft.      Tenderness: There is no abdominal tenderness.  Musculoskeletal:     Cervical back: Normal range of motion and neck supple.     Right lower leg: No edema.     Left lower leg: No edema.  Lymphadenopathy:     Head:     Right side of head: No submental, submandibular or preauricular adenopathy.     Left side of head: No submental, submandibular or preauricular adenopathy.     Cervical:     Right cervical: No superficial or posterior cervical adenopathy.    Left cervical: No superficial or posterior cervical adenopathy.     Upper Body:     Right upper body: No supraclavicular adenopathy.     Left upper body: No supraclavicular adenopathy.  Skin:    General: Skin is warm and dry.  Neurological:     General: No focal deficit present.     Mental Status: She is alert and oriented to person, place, and time.     GCS: GCS eye subscore is 4. GCS verbal subscore is 5. GCS motor subscore is 6.     Cranial Nerves: No cranial nerve deficit, dysarthria or facial asymmetry.     Motor: No weakness, tremor, atrophy or abnormal muscle tone.     Gait: Gait normal.     Deep Tendon Reflexes:     Reflex Scores:      Patellar reflexes are 2+ on the right side and 2+ on the left side. Psychiatric:        Attention and Perception: Attention and perception normal.        Mood and Affect: Mood and affect normal.        Speech: Speech normal.        Behavior: Behavior normal. Behavior is cooperative.        Thought Content: Thought content normal.      No results found for this or any previous visit (from the past 2160 hour(s)).   Fall Risk:    01/03/2023    8:28 AM 12/05/2021    8:36 AM 06/07/2021    3:55 PM 09/29/2020    3:46 PM 09/29/2020    3:40 PM  Fall Risk   Falls in the past year? 0 0 0 0 0  Number falls in past yr: 0 0 0 0 0  Injury with Fall? 0 0 0 0 0  Risk for fall due to : No Fall Risks No Fall Risks No Fall Risks  No Fall Risks  Follow up Falls evaluation completed Falls evaluation  completed Falls evaluation completed  Falls evaluation completed     Functional Status Survey:     Assessment & Plan  Problem List Items Addressed This Visit       Cardiovascular and Mediastinum   Hypertension    Chronic, appears undertreated at this time She is only taking Amlodipine 10 mg PO every day right now as she reports getting back pain with Losartan-hydrochlorothiazide so she stopped taking it Suspect this may be muscular in nature- likely cramping so will add Losartan 100 mg PO every day to regimen only  Recommend checking BP at home daily and recording it for review at follow up Follow up in 4 weeks to recheck BP and assess response to med changes       Relevant Medications   losartan (COZAAR) 100 MG tablet   amLODipine (NORVASC) 10 MG tablet  Other Relevant Orders   Comp Met (CMET)   CBC w/Diff     Other   Hypertriglyceridemia    Recheck Lipid panel today- results to dictate further management Not currently taking cholesterol medications       Relevant Medications   losartan (COZAAR) 100 MG tablet   amLODipine (NORVASC) 10 MG tablet   Other Relevant Orders   Lipid Profile   Advanced care planning/counseling discussion    A voluntary discussion about advance care planning including the explanation and discussion of advance directives was extensively discussed  with the patient for 5 minutes with patient and myself present.  Explanation about the health care proxy and Living will was reviewed and packet with forms with explanation of how to fill them out was given.  During this discussion, the patient was not able to identify a health care proxy and plans to fill out the paperwork required.  Patient was offered a separate Advance Care Planning visit for further assistance with forms.         Other Visit Diagnoses     Annual physical exam    -  Primary -USPSTF grade A and B recommendations reviewed with patient; age-appropriate recommendations, preventive  care, screening tests, etc discussed and encouraged; healthy living encouraged; see AVS for patient education given to patient -Discussed importance of 150 minutes of physical activity weekly, eat two servings of fish weekly, eat one serving of tree nuts ( cashews, pistachios, pecans, almonds.Marland Kitchen) every other day, eat 6 servings of fruit/vegetables daily and drink plenty of water and avoid sweet beverages.   -Reviewed Health Maintenance: Yes.     Relevant Orders   Comp Met (CMET)   CBC w/Diff   Lipid Profile   HgB A1c   TSH   Screening for STD (sexually transmitted disease)       Relevant Orders   HIV antibody (with reflex)   RPR w/reflex to TrepSure   GC/Chlamydia Probe Amp   WET PREP FOR TRICH, YEAST, CLUE        Return in about 4 weeks (around 01/31/2023) for HTN.   I, Evalene Vath E Clairissa Valvano, PA-C, have reviewed all documentation for this visit. The documentation on 01/03/23 for the exam, diagnosis, procedures, and orders are all accurate and complete.   Jacquelin Hawking, MHS, PA-C [facility]

## 2023-01-03 NOTE — Assessment & Plan Note (Signed)
Recheck Lipid panel today- results to dictate further management Not currently taking cholesterol medications

## 2023-01-03 NOTE — Assessment & Plan Note (Signed)

## 2023-01-06 LAB — LIPID PANEL
Chol/HDL Ratio: 2.8 ratio (ref 0.0–4.4)
Cholesterol, Total: 177 mg/dL (ref 100–199)
HDL: 64 mg/dL (ref >39)
LDL Chol Calc (NIH): 92 mg/dL (ref 0–99)
Triglycerides: 117 mg/dL (ref 0–149)
VLDL Cholesterol Cal: 21 mg/dL (ref 5–40)

## 2023-01-06 LAB — TREPONEMAL ANTIBODIES, TPPA: Treponemal Antibodies, TPPA: NONREACTIVE

## 2023-01-06 LAB — TSH: TSH: 2.54 u[IU]/mL (ref 0.450–4.500)

## 2023-01-06 LAB — COMPREHENSIVE METABOLIC PANEL
ALT: 12 IU/L (ref 0–32)
AST: 18 IU/L (ref 0–40)
Albumin: 4.6 g/dL (ref 3.9–4.9)
Alkaline Phosphatase: 93 IU/L (ref 44–121)
BUN/Creatinine Ratio: 10 — ABNORMAL LOW (ref 12–28)
BUN: 11 mg/dL (ref 8–27)
Bilirubin Total: 0.3 mg/dL (ref 0.0–1.2)
CO2: 26 mmol/L (ref 20–29)
Calcium: 9.9 mg/dL (ref 8.7–10.3)
Chloride: 102 mmol/L (ref 96–106)
Creatinine, Ser: 1.06 mg/dL — ABNORMAL HIGH (ref 0.57–1.00)
Globulin, Total: 2.9 g/dL (ref 1.5–4.5)
Glucose: 96 mg/dL (ref 70–99)
Potassium: 3.7 mmol/L (ref 3.5–5.2)
Sodium: 140 mmol/L (ref 134–144)
Total Protein: 7.5 g/dL (ref 6.0–8.5)
eGFR: 59 mL/min/{1.73_m2} — ABNORMAL LOW (ref >59)

## 2023-01-06 LAB — CBC WITH DIFFERENTIAL/PLATELET
Basophils Absolute: 0 10*3/uL (ref 0.0–0.2)
Basos: 1 %
EOS (ABSOLUTE): 0.3 10*3/uL (ref 0.0–0.4)
Eos: 6 %
Hematocrit: 41.1 % (ref 34.0–46.6)
Hemoglobin: 13.3 g/dL (ref 11.1–15.9)
Immature Grans (Abs): 0 10*3/uL (ref 0.0–0.1)
Immature Granulocytes: 0 %
Lymphocytes Absolute: 1.8 10*3/uL (ref 0.7–3.1)
Lymphs: 39 %
MCH: 27.1 pg (ref 26.6–33.0)
MCHC: 32.4 g/dL (ref 31.5–35.7)
MCV: 84 fL (ref 79–97)
Monocytes Absolute: 0.3 10*3/uL (ref 0.1–0.9)
Monocytes: 7 %
Neutrophils Absolute: 2.2 10*3/uL (ref 1.4–7.0)
Neutrophils: 47 %
Platelets: 224 10*3/uL (ref 150–450)
RBC: 4.9 x10E6/uL (ref 3.77–5.28)
RDW: 13.8 % (ref 11.7–15.4)
WBC: 4.6 10*3/uL (ref 3.4–10.8)

## 2023-01-06 LAB — HEMOGLOBIN A1C
Est. average glucose Bld gHb Est-mCnc: 128 mg/dL
Hgb A1c MFr Bld: 6.1 % — ABNORMAL HIGH (ref 4.8–5.6)

## 2023-01-06 LAB — GC/CHLAMYDIA PROBE AMP
Chlamydia trachomatis, NAA: NEGATIVE
Neisseria Gonorrhoeae by PCR: NEGATIVE

## 2023-01-06 LAB — HIV ANTIBODY (ROUTINE TESTING W REFLEX): HIV Screen 4th Generation wRfx: NONREACTIVE

## 2023-01-06 LAB — RPR W/REFLEX TO TREPSURE: RPR: NONREACTIVE

## 2023-01-08 NOTE — Progress Notes (Signed)
Your labs are back Your kidney function is a bit low but appears to be stable compared to previous results- please make sure you are staying hydrated and I recommend avoiding medications like NSAIDs as these can be hard on the kidneys Your electrolytes and liver function testing were in normal ranges Your A1c was 6.1 which is in the prediabetic range. No medications are indicated at this time but I do recommend reducing your excess sugar intake and getting plenty of exercise to help prevent progression to diabetes.  Your CBC was normal Your cholesterol looks good Your thyroid testing was normal Your HIV, trichomonas, yeast and BV testing were negative. Your syphilis, gonorrhea and chlamydia testing were  negative  Please let us know if you have further questions or concerns.

## 2023-02-06 ENCOUNTER — Encounter: Payer: Self-pay | Admitting: Nurse Practitioner

## 2023-02-06 ENCOUNTER — Ambulatory Visit (INDEPENDENT_AMBULATORY_CARE_PROVIDER_SITE_OTHER): Payer: Managed Care, Other (non HMO) | Admitting: Nurse Practitioner

## 2023-02-06 VITALS — BP 137/81 | HR 65 | Wt 189.2 lb

## 2023-02-06 DIAGNOSIS — I10 Essential (primary) hypertension: Secondary | ICD-10-CM | POA: Diagnosis not present

## 2023-02-06 NOTE — Assessment & Plan Note (Signed)
Chronic.  Controlled.  Continue with current medication regimen of Amlodipine and Losartan.  Recommend checking blood pressure at home.  Will check labs at next visit.  Return to clinic in 3 months for reevaluation.  Call sooner if concerns arise.

## 2023-02-06 NOTE — Progress Notes (Signed)
BP 137/81   Pulse 65   Wt 189 lb 3.2 oz (85.8 kg)   LMP  (LMP Unknown)   SpO2 98%   BMI 29.63 kg/m    Subjective:    Patient ID: Nichole Palmer, female    DOB: [birth year], 62 y.o.   MRN: [id number]  HPI: Nichole Palmer is a 62 y.o. female  Chief Complaint  Patient presents with   Hypertension   HYPERTENSION with Chronic Kidney Disease Hypertension status: controlled  Satisfied with current treatment? yes Duration of hypertension: years BP monitoring frequency:  not checking BP range:  BP medication side effects:  no Medication compliance: excellent compliance Previous BP meds:amlodipine and losartan (cozaar) Aspirin: no Recurrent headaches: no Visual changes: no Palpitations: no Dyspnea: no Chest pain: no Lower extremity edema: no Dizzy/lightheaded: no  Relevant past medical, surgical, family and social history reviewed and updated as indicated. Interim medical history since our last visit reviewed. Allergies and medications reviewed and updated.  Review of Systems  Eyes:  Negative for visual disturbance.  Respiratory:  Negative for cough, chest tightness and shortness of breath.   Cardiovascular:  Negative for chest pain, palpitations and leg swelling.  Neurological:  Negative for dizziness and headaches.    Per HPI unless specifically indicated above     Objective:    BP 137/81   Pulse 65   Wt 189 lb 3.2 oz (85.8 kg)   LMP  (LMP Unknown)   SpO2 98%   BMI 29.63 kg/m   Wt Readings from Last 3 Encounters:  02/06/23 189 lb 3.2 oz (85.8 kg)  01/03/23 188 lb 9.6 oz (85.5 kg)  01/19/22 185 lb (83.9 kg)    Physical Exam Vitals and nursing note reviewed.  Constitutional:      General: She is not in acute distress.    Appearance: Normal appearance. She is normal weight. She is not ill-appearing, toxic-appearing or diaphoretic.  HENT:     Head: Normocephalic.     Right Ear: External ear normal.     Left Ear: External ear normal.     Nose: Nose  normal.     Mouth/Throat:     Mouth: Mucous membranes are moist.     Pharynx: Oropharynx is clear.  Eyes:     General:        Right eye: No discharge.        Left eye: No discharge.     Extraocular Movements: Extraocular movements intact.     Conjunctiva/sclera: Conjunctivae normal.     Pupils: Pupils are equal, round, and reactive to light.  Cardiovascular:     Rate and Rhythm: Normal rate and regular rhythm.     Heart sounds: No murmur heard. Pulmonary:     Effort: Pulmonary effort is normal. No respiratory distress.     Breath sounds: Normal breath sounds. No wheezing or rales.  Musculoskeletal:     Cervical back: Normal range of motion and neck supple.  Skin:    General: Skin is warm and dry.     Capillary Refill: Capillary refill takes less than 2 seconds.  Neurological:     General: No focal deficit present.     Mental Status: She is alert and oriented to person, place, and time. Mental status is at baseline.  Psychiatric:        Mood and Affect: Mood normal.        Behavior: Behavior normal.        Thought Content: Thought content normal.  Judgment: Judgment normal.     Results for orders placed or performed in visit on 01/03/23  GC/Chlamydia Probe Amp   Specimen: Urine   UR  Result Value Ref Range   Chlamydia trachomatis, NAA Negative Negative   Neisseria Gonorrhoeae by PCR Negative Negative  WET PREP FOR TRICH, YEAST, CLUE   Specimen: Urine   Urine  Result Value Ref Range   Trichomonas Exam Negative Negative   Yeast Exam Negative Negative   Clue Cell Exam Negative Negative  Treponemal Antibodies, TPPA  Result Value Ref Range   Treponemal Antibodies, TPPA Non Reactive Non Reactive   Interpretation: Comment   Comp Met (CMET)  Result Value Ref Range   Glucose 96 70 - 99 mg/dL   BUN 11 8 - 27 mg/dL   Creatinine, Ser 1.61 (H) 0.57 - 1.00 mg/dL   eGFR 59 (L) >09 UE/AVW/0.98   BUN/Creatinine Ratio 10 (L) 12 - 28   Sodium 140 134 - 144 mmol/L    Potassium 3.7 3.5 - 5.2 mmol/L   Chloride 102 96 - 106 mmol/L   CO2 26 20 - 29 mmol/L   Calcium 9.9 8.7 - 10.3 mg/dL   Total Protein 7.5 6.0 - 8.5 g/dL   Albumin 4.6 3.9 - 4.9 g/dL   Globulin, Total 2.9 1.5 - 4.5 g/dL   Bilirubin Total 0.3 0.0 - 1.2 mg/dL   Alkaline Phosphatase 93 44 - 121 IU/L   AST 18 0 - 40 IU/L   ALT 12 0 - 32 IU/L  CBC w/Diff  Result Value Ref Range   WBC 4.6 3.4 - 10.8 x10E3/uL   RBC 4.90 3.77 - 5.28 x10E6/uL   Hemoglobin 13.3 11.1 - 15.9 g/dL   Hematocrit 11.9 14.7 - 46.6 %   MCV 84 79 - 97 fL   MCH 27.1 26.6 - 33.0 pg   MCHC 32.4 31.5 - 35.7 g/dL   RDW 82.9 56.2 - 13.0 %   Platelets 224 150 - 450 x10E3/uL   Neutrophils 47 Not Estab. %   Lymphs 39 Not Estab. %   Monocytes 7 Not Estab. %   Eos 6 Not Estab. %   Basos 1 Not Estab. %   Neutrophils Absolute 2.2 1.4 - 7.0 x10E3/uL   Lymphocytes Absolute 1.8 0.7 - 3.1 x10E3/uL   Monocytes Absolute 0.3 0.1 - 0.9 x10E3/uL   EOS (ABSOLUTE) 0.3 0.0 - 0.4 x10E3/uL   Basophils Absolute 0.0 0.0 - 0.2 x10E3/uL   Immature Granulocytes 0 Not Estab. %   Immature Grans (Abs) 0.0 0.0 - 0.1 x10E3/uL  Lipid Profile  Result Value Ref Range   Cholesterol, Total 177 100 - 199 mg/dL   Triglycerides 865 0 - 149 mg/dL   HDL 64 >78 mg/dL   VLDL Cholesterol Cal 21 5 - 40 mg/dL   LDL Chol Calc (NIH) 92 0 - 99 mg/dL   Chol/HDL Ratio 2.8 0.0 - 4.4 ratio  HgB A1c  Result Value Ref Range   Hgb A1c MFr Bld 6.1 (H) 4.8 - 5.6 %   Est. average glucose Bld gHb Est-mCnc 128 mg/dL  TSH  Result Value Ref Range   TSH 2.540 0.450 - 4.500 uIU/mL  HIV antibody (with reflex)  Result Value Ref Range   HIV Screen 4th Generation wRfx Non Reactive Non Reactive  RPR w/reflex to TrepSure  Result Value Ref Range   RPR Non Reactive Non Reactive      Assessment & Plan:   Problem List Items Addressed This Visit  Cardiovascular and Mediastinum   Hypertension - Primary    Chronic.  Controlled.  Continue with current medication  regimen of Amlodipine and Losartan.  Recommend checking blood pressure at home.  Will check labs at next visit.  Return to clinic in 3 months for reevaluation.  Call sooner if concerns arise.          Follow up plan: Return in about 3 months (around 05/09/2023) for HTN, HLD, DM2 FU.

## 2023-05-09 ENCOUNTER — Encounter: Payer: Self-pay | Admitting: Nurse Practitioner

## 2023-05-09 ENCOUNTER — Ambulatory Visit (INDEPENDENT_AMBULATORY_CARE_PROVIDER_SITE_OTHER): Payer: Managed Care, Other (non HMO) | Admitting: Nurse Practitioner

## 2023-05-09 VITALS — BP 153/92 | HR 77 | Temp 97.6°F | Ht 67.0 in | Wt 189.2 lb

## 2023-05-09 DIAGNOSIS — I10 Essential (primary) hypertension: Secondary | ICD-10-CM | POA: Diagnosis not present

## 2023-05-09 DIAGNOSIS — R7303 Prediabetes: Secondary | ICD-10-CM | POA: Insufficient documentation

## 2023-05-09 MED ORDER — LISINOPRIL 20 MG PO TABS: 20.0000 mg | ORAL_TABLET | Freq: Every day | ORAL | 0 refills | Status: DC

## 2023-05-09 NOTE — Progress Notes (Signed)
 BP (!) 153/92 (BP Location: Right Arm, Patient Position: Sitting, Cuff Size: Large)   Pulse 77   Temp 97.6 F (36.4 C) (Oral)   Ht 5' 7 (1.702 m)   Wt 189 lb 3.2 oz (85.8 kg)   LMP  (LMP Unknown)   SpO2 97%   BMI 29.63 kg/m    Subjective:    Patient ID: Nichole Palmer, female    DOB: 1960/09/10, 63 y.o.   MRN: [id number]  HPI: Nichole Palmer is a 63 y.o. female  Chief Complaint  Patient presents with   3 month follow up    Hypertension    Medication that was changed pt stated she can't take it, pt not sure what the name of it was   HYPERTENSION / HYPERLIPIDEMIA Patient states the losartan  makes her back cramp up.  She has tried stopping it and it improves then restarts the medication and it restarts.   Satisfied with current treatment? no Duration of hypertension: years BP monitoring frequency:  sometimes BP range: 140/79 BP medication side effects: no Past BP meds: amlodipine  Duration of hyperlipidemia: years Cholesterol medication side effects: no Cholesterol supplements: none Past cholesterol medications: none Medication compliance: excellent compliance Aspirin: no Recent stressors: no Recurrent headaches: no Visual changes: no Palpitations: no Dyspnea: no Chest pain: no Lower extremity edema: no Dizzy/lightheaded: no    Relevant past medical, surgical, family and social history reviewed and updated as indicated. Interim medical history since our last visit reviewed. Allergies and medications reviewed and updated.  Review of Systems  Eyes:  Negative for visual disturbance.  Respiratory:  Negative for cough, chest tightness and shortness of breath.   Cardiovascular:  Negative for chest pain, palpitations and leg swelling.  Neurological:  Negative for dizziness and headaches.    Per HPI unless specifically indicated above     Objective:    BP (!) 153/92 (BP Location: Right Arm, Patient Position: Sitting, Cuff Size: Large)   Pulse 77   Temp  97.6 F (36.4 C) (Oral)   Ht 5' 7 (1.702 m)   Wt 189 lb 3.2 oz (85.8 kg)   LMP  (LMP Unknown)   SpO2 97%   BMI 29.63 kg/m   Wt Readings from Last 3 Encounters:  05/09/23 189 lb 3.2 oz (85.8 kg)  02/06/23 189 lb 3.2 oz (85.8 kg)  01/03/23 188 lb 9.6 oz (85.5 kg)    Physical Exam Vitals and nursing note reviewed.  Constitutional:      General: She is not in acute distress.    Appearance: Normal appearance. She is normal weight. She is not ill-appearing, toxic-appearing or diaphoretic.  HENT:     Head: Normocephalic.     Right Ear: External ear normal.     Left Ear: External ear normal.     Nose: Nose normal.     Mouth/Throat:     Mouth: Mucous membranes are moist.     Pharynx: Oropharynx is clear.  Eyes:     General:        Right eye: No discharge.        Left eye: No discharge.     Extraocular Movements: Extraocular movements intact.     Conjunctiva/sclera: Conjunctivae normal.     Pupils: Pupils are equal, round, and reactive to light.  Cardiovascular:     Rate and Rhythm: Normal rate and regular rhythm.     Heart sounds: No murmur heard. Pulmonary:     Effort: Pulmonary effort is normal. No respiratory  distress.     Breath sounds: Normal breath sounds. No wheezing or rales.  Musculoskeletal:     Cervical back: Normal range of motion and neck supple.  Skin:    General: Skin is warm and dry.     Capillary Refill: Capillary refill takes less than 2 seconds.  Neurological:     General: No focal deficit present.     Mental Status: She is alert and oriented to person, place, and time. Mental status is at baseline.  Psychiatric:        Mood and Affect: Mood normal.        Behavior: Behavior normal.        Thought Content: Thought content normal.        Judgment: Judgment normal.     Results for orders placed or performed in visit on 2023/01/31  GC/Chlamydia Probe Amp   Collection Time: 01-31-23  8:57 AM   Specimen: Urine   UR  Result Value Ref Range   Chlamydia  trachomatis, NAA Negative Negative   Neisseria Gonorrhoeae by PCR Negative Negative  WET PREP FOR TRICH, YEAST, CLUE   Collection Time: 01/31/23  8:57 AM   Specimen: Urine   Urine  Result Value Ref Range   Trichomonas Exam Negative Negative   Yeast Exam Negative Negative   Clue Cell Exam Negative Negative  Treponemal Antibodies, TPPA   Collection Time: 31-Jan-2023  9:02 AM  Result Value Ref Range   Treponemal Antibodies, TPPA Non Reactive Non Reactive   Interpretation: Comment   Comp Met (CMET)   Collection Time: 2023-01-31  9:02 AM  Result Value Ref Range   Glucose 96 70 - 99 mg/dL   BUN 11 8 - 27 mg/dL   Creatinine, Ser 8.93 (H) 0.57 - 1.00 mg/dL   eGFR 59 (L) >40 fO/fpw/8.26   BUN/Creatinine Ratio 10 (L) 12 - 28   Sodium 140 134 - 144 mmol/L   Potassium 3.7 3.5 - 5.2 mmol/L   Chloride 102 96 - 106 mmol/L   CO2 26 20 - 29 mmol/L   Calcium 9.9 8.7 - 10.3 mg/dL   Total Protein 7.5 6.0 - 8.5 g/dL   Albumin 4.6 3.9 - 4.9 g/dL   Globulin, Total 2.9 1.5 - 4.5 g/dL   Bilirubin Total 0.3 0.0 - 1.2 mg/dL   Alkaline Phosphatase 93 44 - 121 IU/L   AST 18 0 - 40 IU/L   ALT 12 0 - 32 IU/L  CBC w/Diff   Collection Time: 2023-01-31  9:02 AM  Result Value Ref Range   WBC 4.6 3.4 - 10.8 x10E3/uL   RBC 4.90 3.77 - 5.28 x10E6/uL   Hemoglobin 13.3 11.1 - 15.9 g/dL   Hematocrit 58.8 65.9 - 46.6 %   MCV 84 79 - 97 fL   MCH 27.1 26.6 - 33.0 pg   MCHC 32.4 31.5 - 35.7 g/dL   RDW 86.1 88.2 - 84.5 %   Platelets 224 150 - 450 x10E3/uL   Neutrophils 47 Not Estab. %   Lymphs 39 Not Estab. %   Monocytes 7 Not Estab. %   Eos 6 Not Estab. %   Basos 1 Not Estab. %   Neutrophils Absolute 2.2 1.4 - 7.0 x10E3/uL   Lymphocytes Absolute 1.8 0.7 - 3.1 x10E3/uL   Monocytes Absolute 0.3 0.1 - 0.9 x10E3/uL   EOS (ABSOLUTE) 0.3 0.0 - 0.4 x10E3/uL   Basophils Absolute 0.0 0.0 - 0.2 x10E3/uL   Immature Granulocytes 0 Not Estab. %   Immature  Grans (Abs) 0.0 0.0 - 0.1 x10E3/uL  Lipid Profile   Collection  Time: 01/03/23  9:02 AM  Result Value Ref Range   Cholesterol, Total 177 100 - 199 mg/dL   Triglycerides 882 0 - 149 mg/dL   HDL 64 >60 mg/dL   VLDL Cholesterol Cal 21 5 - 40 mg/dL   LDL Chol Calc (NIH) 92 0 - 99 mg/dL   Chol/HDL Ratio 2.8 0.0 - 4.4 ratio  HgB A1c   Collection Time: 01/03/23  9:02 AM  Result Value Ref Range   Hgb A1c MFr Bld 6.1 (H) 4.8 - 5.6 %   Est. average glucose Bld gHb Est-mCnc 128 mg/dL  TSH   Collection Time: 01/03/23  9:02 AM  Result Value Ref Range   TSH 2.540 0.450 - 4.500 uIU/mL  HIV antibody (with reflex)   Collection Time: 01/03/23  9:02 AM  Result Value Ref Range   HIV Screen 4th Generation wRfx Non Reactive Non Reactive  RPR w/reflex to TrepSure   Collection Time: 01/03/23  9:02 AM  Result Value Ref Range   RPR Non Reactive Non Reactive      Assessment & Plan:   Problem List Items Addressed This Visit       Cardiovascular and Mediastinum   Hypertension - Primary   Chronic. Not well controlled.  Stopped losartan  due to back cramps.  Will start Lisinopril  20mg . Continue with Amlodipine .  Follow up in 1 month.  Will check labs at next visit.       Relevant Medications   lisinopril  (ZESTRIL ) 20 MG tablet     Follow up plan: Return in about 1 month (around 06/09/2023) for BP Check.

## 2023-05-09 NOTE — Assessment & Plan Note (Signed)
 Chronic. Not well controlled.  Stopped losartan due to back cramps.  Will start Lisinopril 20mg . Continue with Amlodipine.  Follow up in 1 month.  Will check labs at next visit.

## 2023-06-10 ENCOUNTER — Ambulatory Visit: Payer: Self-pay | Admitting: Nurse Practitioner

## 2024-01-15 ENCOUNTER — Ambulatory Visit (INDEPENDENT_AMBULATORY_CARE_PROVIDER_SITE_OTHER): Admitting: Nurse Practitioner

## 2024-01-15 ENCOUNTER — Encounter: Payer: Self-pay | Admitting: Nurse Practitioner

## 2024-01-15 VITALS — BP 132/85 | HR 64 | Temp 98.2°F | Ht 67.4 in | Wt 186.2 lb

## 2024-01-15 DIAGNOSIS — L0292 Furuncle, unspecified: Secondary | ICD-10-CM

## 2024-01-15 DIAGNOSIS — R7303 Prediabetes: Secondary | ICD-10-CM | POA: Diagnosis not present

## 2024-01-15 DIAGNOSIS — I10 Essential (primary) hypertension: Secondary | ICD-10-CM

## 2024-01-15 DIAGNOSIS — Z0000 Encounter for general adult medical examination without abnormal findings: Secondary | ICD-10-CM | POA: Diagnosis not present

## 2024-01-15 DIAGNOSIS — E781 Pure hyperglyceridemia: Secondary | ICD-10-CM

## 2024-01-15 DIAGNOSIS — Z1231 Encounter for screening mammogram for malignant neoplasm of breast: Secondary | ICD-10-CM

## 2024-01-15 MED ORDER — AMLODIPINE BESYLATE 10 MG PO TABS: 10.0000 mg | ORAL_TABLET | Freq: Every day | ORAL | 1 refills | Status: AC

## 2024-01-15 MED ORDER — LISINOPRIL 20 MG PO TABS: 20.0000 mg | ORAL_TABLET | Freq: Every day | ORAL | 1 refills | Status: AC

## 2024-01-15 MED ORDER — DOXYCYCLINE HYCLATE 100 MG PO TABS
100.0000 mg | ORAL_TABLET | Freq: Two times a day (BID) | ORAL | 0 refills | Status: AC
Start: 2024-01-15 — End: ?

## 2024-01-15 NOTE — Assessment & Plan Note (Signed)
 Chronic.  Controlled.  Continue with current medication regimen.  Labs ordered today.  Return to clinic in 6 months for reevaluation.  Call sooner if concerns arise.  ? ?

## 2024-01-15 NOTE — Assessment & Plan Note (Signed)
 Chronic.  Controlled.  Continue with current medication regimen of Amlodipine  10mg  and Lisinipril 20mg .  Refills sent today.  Labs ordered today.  Return to clinic in 6 months for reevaluation.  Call sooner if concerns arise.

## 2024-01-15 NOTE — Patient Instructions (Signed)
 Please call to schedule your mammogram: [facility] at [facility]  Address: [address], [place], [place] [address] Phone: [phone]  [facility] at [facility] [address] [place],  [place]  [address] Phone: [phone]

## 2024-01-15 NOTE — Progress Notes (Signed)
 BP 132/85 (BP Location: Left Arm, Cuff Size: Normal)   Pulse 64   Temp 98.2 F (36.8 C) (Oral)   Ht 5' 7.4 (1.712 m)   Wt 186 lb 3.2 oz (84.5 kg)   LMP  (LMP Unknown)   SpO2 98%   BMI 28.82 kg/m    Subjective:    Patient ID: Nichole Palmer, female    DOB: 12-27-1960, 63 y.o.   MRN: [id number]  HPI: Nichole Palmer is a 63 y.o. female presenting on 01/15/2024 for comprehensive medical examination. Current medical complaints include:boil in vaginal area  She currently lives with: Menopausal Symptoms: no  HYPERTENSION with Chronic Kidney Disease Hypertension status: controlled  Satisfied with current treatment? yes Duration of hypertension: years BP monitoring frequency: sometimes BP range: 130/80 BP medication side effects:  no Medication compliance: excellent compliance Previous BP meds:amlodipine  and lisinopril  Aspirin: no Recurrent headaches: no Visual changes: no Palpitations: no Dyspnea: no Chest pain: no Lower extremity edema: no Dizzy/lightheaded: no  Has a recurrent boil in the vaginal area.  Is painful, pops with a small amount of discharge.  Goes away then comes back.  Depression Screen done today and results listed below:     01/15/2024   10:08 AM 05/09/2023    3:54 PM 02/06/2023    3:51 PM 01/03/2023    8:28 AM 12/05/2021    8:36 AM  Depression screen PHQ 2/9  Decreased Interest 0 0 0 0 0  Down, Depressed, Hopeless 0 0 0 0 0  PHQ - 2 Score 0 0 0 0 0  Altered sleeping 0 0 0 0 0  Tired, decreased energy 0 0 0 0 0  Change in appetite 0 0 0 1 0  Feeling bad or failure about yourself  0 0 0 0 0  Trouble concentrating 0 0 0 0 0  Moving slowly or fidgety/restless 0 0 0 0 0  Suicidal thoughts 0 0 0 0 0  PHQ-9 Score 0 0 0 1 0  Difficult doing work/chores Not difficult at all  Not difficult at all Not difficult at all Not difficult at all    The patient does not have a history of falls. I did complete a risk assessment for falls. A plan of care for falls  was documented.   Past Medical History:  Past Medical History:  Diagnosis Date   Hypertension     Surgical History:  Past Surgical History:  Procedure Laterality Date   COLONOSCOPY WITH PROPOFOL  N/A 01/19/2022   Procedure: COLONOSCOPY WITH PROPOFOL ;  Surgeon: Therisa Bi, MD;  Location: [facility] ENDOSCOPY;  Service: Gastroenterology;  Laterality: N/A;   TOTAL ABDOMINAL HYSTERECTOMY     partial    Medications:  Current Outpatient Medications on File Prior to Visit  Medication Sig   diphenhydrAMINE HCl (BENADRYL ALLERGY PO) Take by mouth daily.   fluticasone  (FLONASE ) 50 MCG/ACT nasal spray Place 2 sprays into both nostrils daily.   No current facility-administered medications on file prior to visit.    Allergies:  Allergies  Allergen Reactions   Sulfa Antibiotics    Sulfacetamide Sodium     Social History:  Social History   Socioeconomic History   Marital status: Divorced    Spouse name: Not on file   Number of children: Not on file   Years of education: Not on file   Highest education level: Not on file  Occupational History   Not on file  Tobacco Use   Smoking status: Never   Smokeless  tobacco: Never  Vaping Use   Vaping status: Never Used  Substance and Sexual Activity   Alcohol use: Yes    Comment: social   Drug use: No   Sexual activity: Yes  Other Topics Concern   Not on file  Social History Narrative   Not on file   Social Drivers of Health   Financial Resource Strain: Low Risk  (01/15/2024)   Overall Financial Resource Strain (CARDIA)    Difficulty of Paying Living Expenses: Not very hard  Food Insecurity: No Food Insecurity (01/15/2024)   Hunger Vital Sign    Worried About Running Out of Food in the Last Year: Never true    Ran Out of Food in the Last Year: Never true  Transportation Needs: No Transportation Needs (01/15/2024)   PRAPARE - Administrator, Civil Service (Medical): No    Lack of Transportation (Non-Medical): No   Physical Activity: Inactive (01/15/2024)   Exercise Vital Sign    Days of Exercise per Week: 0 days    Minutes of Exercise per Session: 0 min  Stress: No Stress Concern Present (01/15/2024)   [employer] of Occupational Health - Occupational Stress Questionnaire    Feeling of Stress: Not at all  Social Connections: Moderately Integrated (01/15/2024)   Social Connection and Isolation Panel    Frequency of Communication with Friends and Family: More than three times a week    Frequency of Social Gatherings with Friends and Family: Once a week    Attends Religious Services: More than 4 times per year    Active Member of [employer] or Organizations: No    Attends Banker Meetings: Never    Marital Status: Married  Catering manager Violence: Not At Risk (01/15/2024)   Humiliation, Afraid, Rape, and Kick questionnaire    Fear of Current or Ex-Partner: No    Emotionally Abused: No    Physically Abused: No    Sexually Abused: No   Social History   Tobacco Use  Smoking Status Never  Smokeless Tobacco Never   Social History   Substance and Sexual Activity  Alcohol Use Yes   Comment: social    Family History:  Family History  Problem Relation Age of Onset   Migraines Sister    Seizures Daughter    COPD Daughter    Heart disease Maternal Grandmother    Breast cancer Neg Hx     Past medical history, surgical history, medications, allergies, family history and social history reviewed with patient today and changes made to appropriate areas of the chart.   Review of Systems  Eyes:  Negative for blurred vision and double vision.  Respiratory:  Negative for shortness of breath.   Cardiovascular:  Negative for chest pain, palpitations and leg swelling.  Genitourinary:        Recurrent boil in vaginal area  Neurological:  Negative for dizziness and headaches.   All other ROS negative except what is listed above and in the HPI.      Objective:    BP 132/85 (BP  Location: Left Arm, Cuff Size: Normal)   Pulse 64   Temp 98.2 F (36.8 C) (Oral)   Ht 5' 7.4 (1.712 m)   Wt 186 lb 3.2 oz (84.5 kg)   LMP  (LMP Unknown)   SpO2 98%   BMI 28.82 kg/m   Wt Readings from Last 3 Encounters:  01/15/24 186 lb 3.2 oz (84.5 kg)  05/09/23 189 lb 3.2 oz (85.8 kg)  02/06/23 189 lb 3.2 oz (85.8 kg)    Physical Exam Vitals and nursing note reviewed.  Constitutional:      General: She is awake. She is not in acute distress.    Appearance: Normal appearance. She is well-developed. She is not ill-appearing.  HENT:     Head: Normocephalic and atraumatic.     Right Ear: Hearing, tympanic membrane, ear canal and external ear normal. No drainage.     Left Ear: Hearing, tympanic membrane, ear canal and external ear normal. No drainage.     Nose: Nose normal.     Right Sinus: No maxillary sinus tenderness or frontal sinus tenderness.     Left Sinus: No maxillary sinus tenderness or frontal sinus tenderness.     Mouth/Throat:     Mouth: Mucous membranes are moist.     Pharynx: Oropharynx is clear. Uvula midline. No pharyngeal swelling, oropharyngeal exudate or posterior oropharyngeal erythema.  Eyes:     General: Lids are normal.        Right eye: No discharge.        Left eye: No discharge.     Extraocular Movements: Extraocular movements intact.     Conjunctiva/sclera: Conjunctivae normal.     Pupils: Pupils are equal, round, and reactive to light.     Visual Fields: Right eye visual fields normal and left eye visual fields normal.  Neck:     Thyroid: No thyromegaly.     Vascular: No carotid bruit.     Trachea: Trachea normal.  Cardiovascular:     Rate and Rhythm: Normal rate and regular rhythm.     Heart sounds: Normal heart sounds. No murmur heard.    No gallop.  Pulmonary:     Effort: Pulmonary effort is normal. No accessory muscle usage or respiratory distress.     Breath sounds: Normal breath sounds.  Chest:  Breasts:    Right: Normal.     Left:  Normal.  Abdominal:     General: Bowel sounds are normal.     Palpations: Abdomen is soft. There is no hepatomegaly or splenomegaly.     Tenderness: There is no abdominal tenderness.  Musculoskeletal:        General: Normal range of motion.     Cervical back: Normal range of motion and neck supple.     Right lower leg: No edema.     Left lower leg: No edema.  Lymphadenopathy:     Head:     Right side of head: No submental, submandibular, tonsillar, preauricular or posterior auricular adenopathy.     Left side of head: No submental, submandibular, tonsillar, preauricular or posterior auricular adenopathy.     Cervical: No cervical adenopathy.     Upper Body:     Right upper body: No supraclavicular, axillary or pectoral adenopathy.     Left upper body: No supraclavicular, axillary or pectoral adenopathy.  Skin:    General: Skin is warm and dry.     Capillary Refill: Capillary refill takes less than 2 seconds.     Findings: No rash.  Neurological:     Mental Status: She is alert and oriented to person, place, and time.     Gait: Gait is intact.  Psychiatric:        Attention and Perception: Attention normal.        Mood and Affect: Mood normal.        Speech: Speech normal.        Behavior: Behavior normal. Behavior  is cooperative.        Thought Content: Thought content normal.        Judgment: Judgment normal.     Results for orders placed or performed in visit on 01-20-2023  GC/Chlamydia Probe Amp   Collection Time: 01/20/2023  8:57 AM   Specimen: Urine   UR  Result Value Ref Range   Chlamydia trachomatis, NAA Negative Negative   Neisseria Gonorrhoeae by PCR Negative Negative  WET PREP FOR TRICH, YEAST, CLUE   Collection Time: 01-20-23  8:57 AM   Specimen: Urine   Urine  Result Value Ref Range   Trichomonas Exam Negative Negative   Yeast Exam Negative Negative   Clue Cell Exam Negative Negative  Treponemal Antibodies, TPPA   Collection Time: 01/20/23  9:02 AM  Result  Value Ref Range   Treponemal Antibodies, TPPA Non Reactive Non Reactive   Interpretation: Comment   Comp Met (CMET)   Collection Time: 01/20/2023  9:02 AM  Result Value Ref Range   Glucose 96 70 - 99 mg/dL   BUN 11 8 - 27 mg/dL   Creatinine, Ser 8.93 (H) 0.57 - 1.00 mg/dL   eGFR 59 (L) >40 fO/fpw/8.26   BUN/Creatinine Ratio 10 (L) 12 - 28   Sodium 140 134 - 144 mmol/L   Potassium 3.7 3.5 - 5.2 mmol/L   Chloride 102 96 - 106 mmol/L   CO2 26 20 - 29 mmol/L   Calcium 9.9 8.7 - 10.3 mg/dL   Total Protein 7.5 6.0 - 8.5 g/dL   Albumin 4.6 3.9 - 4.9 g/dL   Globulin, Total 2.9 1.5 - 4.5 g/dL   Bilirubin Total 0.3 0.0 - 1.2 mg/dL   Alkaline Phosphatase 93 44 - 121 IU/L   AST 18 0 - 40 IU/L   ALT 12 0 - 32 IU/L  CBC w/Diff   Collection Time: January 20, 2023  9:02 AM  Result Value Ref Range   WBC 4.6 3.4 - 10.8 x10E3/uL   RBC 4.90 3.77 - 5.28 x10E6/uL   Hemoglobin 13.3 11.1 - 15.9 g/dL   Hematocrit 58.8 65.9 - 46.6 %   MCV 84 79 - 97 fL   MCH 27.1 26.6 - 33.0 pg   MCHC 32.4 31.5 - 35.7 g/dL   RDW 86.1 88.2 - 84.5 %   Platelets 224 150 - 450 x10E3/uL   Neutrophils 47 Not Estab. %   Lymphs 39 Not Estab. %   Monocytes 7 Not Estab. %   Eos 6 Not Estab. %   Basos 1 Not Estab. %   Neutrophils Absolute 2.2 1.4 - 7.0 x10E3/uL   Lymphocytes Absolute 1.8 0.7 - 3.1 x10E3/uL   Monocytes Absolute 0.3 0.1 - 0.9 x10E3/uL   EOS (ABSOLUTE) 0.3 0.0 - 0.4 x10E3/uL   Basophils Absolute 0.0 0.0 - 0.2 x10E3/uL   Immature Granulocytes 0 Not Estab. %   Immature Grans (Abs) 0.0 0.0 - 0.1 x10E3/uL  Lipid Profile   Collection Time: January 20, 2023  9:02 AM  Result Value Ref Range   Cholesterol, Total 177 100 - 199 mg/dL   Triglycerides 882 0 - 149 mg/dL   HDL 64 >60 mg/dL   VLDL Cholesterol Cal 21 5 - 40 mg/dL   LDL Chol Calc (NIH) 92 0 - 99 mg/dL   Chol/HDL Ratio 2.8 0.0 - 4.4 ratio  HgB A1c   Collection Time: 01-20-23  9:02 AM  Result Value Ref Range   Hgb A1c MFr Bld 6.1 (H) 4.8 - 5.6 %   Est.  average  glucose Bld gHb Est-mCnc 128 mg/dL  TSH   Collection Time: 01/03/23  9:02 AM  Result Value Ref Range   TSH 2.540 0.450 - 4.500 uIU/mL  HIV antibody (with reflex)   Collection Time: 01/03/23  9:02 AM  Result Value Ref Range   HIV Screen 4th Generation wRfx Non Reactive Non Reactive  RPR w/reflex to TrepSure   Collection Time: 01/03/23  9:02 AM  Result Value Ref Range   RPR Non Reactive Non Reactive      Assessment & Plan:   Problem List Items Addressed This Visit       Cardiovascular and Mediastinum   Hypertension   Chronic.  Controlled.  Continue with current medication regimen of Amlodipine  10mg  and Lisinipril 20mg .  Refills sent today.  Labs ordered today.  Return to clinic in 6 months for reevaluation.  Call sooner if concerns arise.        Relevant Medications   amLODipine  (NORVASC ) 10 MG tablet   lisinopril  (ZESTRIL ) 20 MG tablet     Other   Hypertriglyceridemia   Chronic.  Controlled.  Continue with current medication regimen.  Labs ordered today.  Return to clinic in 6 months for reevaluation.  Call sooner if concerns arise.        Relevant Medications   amLODipine  (NORVASC ) 10 MG tablet   lisinopril  (ZESTRIL ) 20 MG tablet   Other Relevant Orders   Lipid panel   Prediabetes   Chronic.  Controlled.  Continue with current medication regimen.  Labs ordered today.  Return to clinic in 6 months for reevaluation.  Call sooner if concerns arise.        Relevant Orders   Hemoglobin A1c   Other Visit Diagnoses       Annual physical exam    -  Primary   Health maintenance reviewed during visit today.  Labs ordered. Vaccines reviewed.  Mammogram ordered.  Colonoscopy up to date.   Relevant Orders   CBC with Differential/Platelet   Comprehensive metabolic panel with GFR   Lipid panel   TSH   Hemoglobin A1c     Encounter for screening mammogram for malignant neoplasm of breast       Relevant Orders   MM 3D SCREENING MAMMOGRAM BILATERAL BREAST     Recurrent  boils       Will treat with doxycyline.  Complete course of antibiotics.  Follow up if not improved.        Follow up plan: Return in about 6 months (around 07/14/2024) for HTN, HLD, DM2 FU.   LABORATORY TESTING:  - Pap smear: not applicable  IMMUNIZATIONS:   - Tdap: Tetanus vaccination status reviewed: Up to date. - Influenza: Refused - Pneumovax: Refused - Prevnar: Refused - COVID: Refused - HPV: Not applicable - Shingrix vaccine: Refused  SCREENING: -Mammogram: Ordered today  - Colonoscopy: Up to date  - Bone Density: Not applicable  -Hearing Test: Not applicable  -Spirometry: Not applicable   PATIENT COUNSELING:   Advised to take 1 mg of folate supplement per day if capable of pregnancy.   Sexuality: Discussed sexually transmitted diseases, partner selection, use of condoms, avoidance of unintended pregnancy  and contraceptive alternatives.   Advised to avoid cigarette smoking.  I discussed with the patient that most people either abstain from alcohol or drink within safe limits (<=14/week and <=4 drinks/occasion for males, <=7/weeks and <= 3 drinks/occasion for females) and that the risk for alcohol disorders and other health effects rises proportionally with  the number of drinks per week and how often a drinker exceeds daily limits.  Discussed cessation/primary prevention of drug use and availability of treatment for abuse.   Diet: Encouraged to adjust caloric intake to maintain  or achieve ideal body weight, to reduce intake of dietary saturated fat and total fat, to limit sodium intake by avoiding high sodium foods and not adding table salt, and to maintain adequate dietary potassium and calcium preferably from fresh fruits, vegetables, and low-fat dairy products.    stressed the importance of regular exercise  Injury prevention: Discussed safety belts, safety helmets, smoke detector, smoking near bedding or upholstery.   Dental health: Discussed importance of  regular tooth brushing, flossing, and dental visits.    NEXT PREVENTATIVE PHYSICAL DUE IN 1 YEAR. Return in about 6 months (around 07/14/2024) for HTN, HLD, DM2 FU.

## 2024-01-16 ENCOUNTER — Ambulatory Visit: Payer: Self-pay | Admitting: Nurse Practitioner

## 2024-01-16 LAB — CBC WITH DIFFERENTIAL/PLATELET
Basophils Absolute: 0 x10E3/uL (ref 0.0–0.2)
Basos: 1 %
EOS (ABSOLUTE): 0.2 x10E3/uL (ref 0.0–0.4)
Eos: 5 %
Hematocrit: 41.8 % (ref 34.0–46.6)
Hemoglobin: 13.4 g/dL (ref 11.1–15.9)
Immature Grans (Abs): 0 x10E3/uL (ref 0.0–0.1)
Immature Granulocytes: 0 %
Lymphocytes Absolute: 1.7 x10E3/uL (ref 0.7–3.1)
Lymphs: 40 %
MCH: 27.6 pg (ref 26.6–33.0)
MCHC: 32.1 g/dL (ref 31.5–35.7)
MCV: 86 fL (ref 79–97)
Monocytes Absolute: 0.4 x10E3/uL (ref 0.1–0.9)
Monocytes: 9 %
Neutrophils Absolute: 2 x10E3/uL (ref 1.4–7.0)
Neutrophils: 45 %
Platelets: 216 x10E3/uL (ref 150–450)
RBC: 4.86 x10E6/uL (ref 3.77–5.28)
RDW: 13.8 % (ref 11.7–15.4)
WBC: 4.3 x10E3/uL (ref 3.4–10.8)

## 2024-01-16 LAB — COMPREHENSIVE METABOLIC PANEL WITH GFR
ALT: 12 IU/L (ref 0–32)
AST: 21 IU/L (ref 0–40)
Albumin: 4.5 g/dL (ref 3.9–4.9)
Alkaline Phosphatase: 92 IU/L (ref 49–135)
BUN/Creatinine Ratio: 14 (ref 12–28)
BUN: 14 mg/dL (ref 8–27)
Bilirubin Total: 0.3 mg/dL (ref 0.0–1.2)
CO2: 23 mmol/L (ref 20–29)
Calcium: 9.9 mg/dL (ref 8.7–10.3)
Chloride: 103 mmol/L (ref 96–106)
Creatinine, Ser: 0.99 mg/dL (ref 0.57–1.00)
Globulin, Total: 2.9 g/dL (ref 1.5–4.5)
Glucose: 90 mg/dL (ref 70–99)
Potassium: 4.3 mmol/L (ref 3.5–5.2)
Sodium: 140 mmol/L (ref 134–144)
Total Protein: 7.4 g/dL (ref 6.0–8.5)
eGFR: 64 mL/min/1.73 (ref >59)

## 2024-01-16 LAB — LIPID PANEL
Chol/HDL Ratio: 2.9 ratio (ref 0.0–4.4)
Cholesterol, Total: 162 mg/dL (ref 100–199)
HDL: 56 mg/dL (ref >39)
LDL Chol Calc (NIH): 90 mg/dL (ref 0–99)
Triglycerides: 88 mg/dL (ref 0–149)
VLDL Cholesterol Cal: 16 mg/dL (ref 5–40)

## 2024-01-16 LAB — HEMOGLOBIN A1C
Est. average glucose Bld gHb Est-mCnc: 128 mg/dL
Hgb A1c MFr Bld: 6.1 % — ABNORMAL HIGH (ref 4.8–5.6)

## 2024-01-16 LAB — TSH: TSH: 2.1 u[IU]/mL (ref 0.450–4.500)

## 2024-01-28 ENCOUNTER — Ambulatory Visit: Payer: Self-pay

## 2024-01-28 MED ORDER — FLUCONAZOLE 150 MG PO TABS: 150.0000 mg | ORAL_TABLET | Freq: Once | ORAL | 0 refills | Status: AC

## 2024-01-28 NOTE — Telephone Encounter (Signed)
 Patient aware and will pickup [prescription. If not resolving in 2-3 days she will call to schedule acute visit.

## 2024-01-28 NOTE — Telephone Encounter (Signed)
 Diflucan  sent to the pharmacy. If symptoms are not improved, she should come in for an appointment.

## 2024-01-28 NOTE — Telephone Encounter (Signed)
 FYI Only or Action Required?: Action required by provider: clinical question for provider and update on patient condition.  Patient was last seen in primary care on 01/15/2024 by Melvin Pao, NP.  Called Nurse Triage reporting Vaginitis.  Symptoms began several days ago.  Interventions attempted: Nothing.  Symptoms are: gradually worsening.  Triage Disposition: See Physician Within 24 Hours  Patient/caregiver understands and will follow disposition?: No, wishes to speak with PCP  FYI: Patient says that she usually gets a yeast infection after abx, she just finished a 10 day regimen on 9/27. Itching started soon after.  Requesting medication to be sent to [facility]  Copied from CRM #[id number]. Topic: Clinical - Medication Question >> Jan 28, 2024  2:43 PM Montie POUR wrote: Reason for CRM:  Jovanka needs something for a yeast infection after taking an antibiotic last week. Please send to [facility] on [address]. If there are in questions, please call Phylis at [phone]. Thanks Reason for Disposition  [1] MILD to MODERATE vaginal pain AND [2] present > 24 hours  (Exception: Chronic pain.)  Answer Assessment - Initial Assessment Questions 1. SYMPTOM: What's the main symptom you're concerned about? (e.g., pain, itching, dryness)     Vaginal itching  2. LOCATION: Where is the  itching located? (e.g., inside/outside, left/right)     Outside of the vagina  3. ONSET: When did the  itching  start?     2 days ago  4. PAIN: Is there any pain? If Yes, ask: How bad is it? (Scale: 1-10; mild, moderate, severe)     No pain  5. ITCHING: Is there any itching? If Yes, ask: How bad is it? (Scale: 1-10; mild, moderate, severe)     Mild to moderate itching  6. CAUSE: What do you think is causing the discharge? Have you had the same problem before? What happened then?     No discharge. Pt says she usually get yeast infections after taking antibiotics. Pt just finished a 10  regimen of Doxycycline  from 9/17 to 9/27  7. OTHER SYMPTOMS: Do you have any other symptoms? (e.g., fever, itching, vaginal bleeding, pain with urination, injury to genital area, vaginal foreign body)     No  8. PREGNANCY: Is there any chance you are pregnant? When was your last menstrual period?     No  Protocols used: Vaginal Symptoms-A-AH

## 2024-07-14 ENCOUNTER — Ambulatory Visit: Admitting: Nurse Practitioner
